# Patient Record
Sex: Female | Born: 1975 | Hispanic: Yes | Marital: Single | Smoking: Never smoker
Health system: Southern US, Community
[De-identification: ages and names within clinical notes are randomized; demographics above are authoritative.]

## PROBLEM LIST (undated history)

## (undated) DIAGNOSIS — E785 Hyperlipidemia, unspecified: Secondary | ICD-10-CM

## (undated) HISTORY — PX: NO PAST SURGERIES: SHX2092

---

## 1997-10-15 ENCOUNTER — Inpatient Hospital Stay (HOSPITAL_COMMUNITY): Admission: AD | Admit: 1997-10-15 | Discharge: 1997-10-17 | Payer: Self-pay | Admitting: Obstetrics & Gynecology

## 1997-10-23 ENCOUNTER — Encounter (HOSPITAL_COMMUNITY): Admission: RE | Admit: 1997-10-23 | Discharge: 1998-01-21 | Payer: Self-pay | Admitting: *Deleted

## 2001-10-28 ENCOUNTER — Ambulatory Visit (HOSPITAL_COMMUNITY): Admission: RE | Admit: 2001-10-28 | Discharge: 2001-10-28 | Payer: Self-pay | Admitting: *Deleted

## 2002-04-02 ENCOUNTER — Inpatient Hospital Stay (HOSPITAL_COMMUNITY): Admission: AD | Admit: 2002-04-02 | Discharge: 2002-04-04 | Payer: Self-pay | Admitting: *Deleted

## 2003-03-15 ENCOUNTER — Ambulatory Visit (HOSPITAL_COMMUNITY): Admission: RE | Admit: 2003-03-15 | Discharge: 2003-03-15 | Payer: Self-pay | Admitting: *Deleted

## 2003-07-22 ENCOUNTER — Inpatient Hospital Stay (HOSPITAL_COMMUNITY): Admission: AD | Admit: 2003-07-22 | Discharge: 2003-07-24 | Payer: Self-pay | Admitting: *Deleted

## 2006-04-24 ENCOUNTER — Ambulatory Visit: Payer: Self-pay | Admitting: *Deleted

## 2006-04-29 ENCOUNTER — Ambulatory Visit: Payer: Self-pay | Admitting: Internal Medicine

## 2006-09-02 ENCOUNTER — Ambulatory Visit: Payer: Self-pay | Admitting: Nurse Practitioner

## 2006-09-07 ENCOUNTER — Ambulatory Visit (HOSPITAL_COMMUNITY): Admission: RE | Admit: 2006-09-07 | Discharge: 2006-09-07 | Payer: Self-pay | Admitting: Family Medicine

## 2006-09-17 ENCOUNTER — Ambulatory Visit: Payer: Self-pay | Admitting: Nurse Practitioner

## 2006-10-23 ENCOUNTER — Emergency Department (HOSPITAL_COMMUNITY): Admission: EM | Admit: 2006-10-23 | Discharge: 2006-10-23 | Payer: Self-pay | Admitting: Emergency Medicine

## 2007-04-07 ENCOUNTER — Encounter (INDEPENDENT_AMBULATORY_CARE_PROVIDER_SITE_OTHER): Payer: Self-pay | Admitting: *Deleted

## 2007-06-23 ENCOUNTER — Ambulatory Visit: Payer: Self-pay | Admitting: Internal Medicine

## 2007-06-24 ENCOUNTER — Encounter (INDEPENDENT_AMBULATORY_CARE_PROVIDER_SITE_OTHER): Payer: Self-pay | Admitting: Nurse Practitioner

## 2007-06-24 LAB — CONVERTED CEMR LAB
BUN: 9 mg/dL (ref 6–23)
CO2: 22 meq/L (ref 19–32)
Calcium: 9.5 mg/dL (ref 8.4–10.5)
Chloride: 104 meq/L (ref 96–112)
Cholesterol: 210 mg/dL — ABNORMAL HIGH (ref 0–200)
Creatinine, Ser: 0.69 mg/dL (ref 0.40–1.20)
Glucose, Bld: 89 mg/dL (ref 70–99)
HDL: 46 mg/dL (ref >39)
LDL Cholesterol: 129 mg/dL — ABNORMAL HIGH (ref 0–99)
Potassium: 4 meq/L (ref 3.5–5.3)
Sodium: 141 meq/L (ref 135–145)
Total CHOL/HDL Ratio: 4.6
Triglycerides: 174 mg/dL — ABNORMAL HIGH (ref <150)
VLDL: 35 mg/dL (ref 0–40)

## 2007-09-29 ENCOUNTER — Ambulatory Visit: Payer: Self-pay | Admitting: Family Medicine

## 2007-09-29 ENCOUNTER — Encounter (INDEPENDENT_AMBULATORY_CARE_PROVIDER_SITE_OTHER): Payer: Self-pay | Admitting: Internal Medicine

## 2007-09-29 LAB — CONVERTED CEMR LAB
Cholesterol: 138 mg/dL (ref 0–200)
HDL: 37 mg/dL — ABNORMAL LOW (ref >39)
LDL Cholesterol: 76 mg/dL (ref 0–99)
Total CHOL/HDL Ratio: 3.7
Triglycerides: 127 mg/dL (ref <150)
VLDL: 25 mg/dL (ref 0–40)

## 2011-03-13 ENCOUNTER — Other Ambulatory Visit: Payer: Self-pay | Admitting: Family Medicine

## 2011-03-13 DIAGNOSIS — N63 Unspecified lump in unspecified breast: Secondary | ICD-10-CM

## 2011-03-20 ENCOUNTER — Ambulatory Visit
Admission: RE | Admit: 2011-03-20 | Discharge: 2011-03-20 | Disposition: A | Discharge status: Home / Self Care | Payer: No Typology Code available for payment source | Source: Ambulatory Visit | Attending: Family Medicine | Admitting: Family Medicine

## 2011-03-20 DIAGNOSIS — N63 Unspecified lump in unspecified breast: Secondary | ICD-10-CM

## 2014-01-30 ENCOUNTER — Other Ambulatory Visit (HOSPITAL_COMMUNITY): Payer: Self-pay | Admitting: *Deleted

## 2014-01-30 DIAGNOSIS — N6452 Nipple discharge: Secondary | ICD-10-CM

## 2014-01-31 ENCOUNTER — Encounter (HOSPITAL_COMMUNITY): Payer: Self-pay

## 2014-01-31 ENCOUNTER — Ambulatory Visit (HOSPITAL_COMMUNITY)
Admission: RE | Admit: 2014-01-31 | Discharge: 2014-01-31 | Disposition: A | Discharge status: Home / Self Care | Payer: Self-pay | Source: Ambulatory Visit | Attending: Obstetrics and Gynecology | Admitting: Obstetrics and Gynecology

## 2014-01-31 VITALS — BP 102/76 | Temp 98.3°F | Ht 64.0 in | Wt 184.2 lb

## 2014-01-31 DIAGNOSIS — N6452 Nipple discharge: Secondary | ICD-10-CM | POA: Insufficient documentation

## 2014-01-31 DIAGNOSIS — Z1239 Encounter for other screening for malignant neoplasm of breast: Secondary | ICD-10-CM

## 2014-01-31 HISTORY — DX: Hyperlipidemia, unspecified: E78.5

## 2014-01-31 NOTE — Progress Notes (Signed)
Complaints of left breast bloody discharge x 3 weeks when squeezed.  Pap Smear:  Pap smear not completed today. [facility] and normal. Per patient had an abnormal Pap smear 16 years ago that required a colposcopy for follow-up. Per patient all of her Pap smears have been normal since colposcopy and has at least 3 normal Pap smears completed. Last Pap smear result is in EPIC under media.  Physical exam: Breasts Breasts symmetrical. No skin abnormalities bilateral breasts. No nipple retraction bilateral breasts. No nipple discharge right breast. Bloody left nipple discharge when expressed. Sample of bloody discharge sent to cytology. No lymphadenopathy. No lumps palpated bilateral breasts. Complaints of left center breast tenderness on exam. [facility]. Appointment scheduled for Monday, February 06, 2014 at 1315.       Pelvic/Bimanual No Pap smear completed today since last Pap smear was 04/20/2013. Pap smear not indicated per BCCCP guidelines.

## 2014-01-31 NOTE — Patient Instructions (Signed)
Taught 11 Nicole Velasquez how to perform BSE and gave educational materials to take home. Patient did not need a Pap smear today due to last Pap smear was 04/20/2013. Let her know BCCCP will cover Pap smears every 3 years unless has a history of abnormal Pap smears. [facility]. Appointment scheduled for Monday, February 06, 2014 at 1315. Patient aware of appointment and will be there. Let patient know will follow up with her within the next couple weeks with results of breast discharge by phone. Nicole Velasquez verbalized understanding.  Rembert Browe, Kathaleen Maserhristine Poll, RN 3:26 PM

## 2014-02-06 ENCOUNTER — Ambulatory Visit
Admission: RE | Admit: 2014-02-06 | Discharge: 2014-02-06 | Disposition: A | Discharge status: Home / Self Care | Payer: No Typology Code available for payment source | Source: Ambulatory Visit | Attending: Obstetrics and Gynecology | Admitting: Obstetrics and Gynecology

## 2014-02-06 DIAGNOSIS — N6452 Nipple discharge: Secondary | ICD-10-CM

## 2014-05-22 ENCOUNTER — Encounter (HOSPITAL_COMMUNITY): Payer: Self-pay

## 2015-07-22 HISTORY — PX: BREAST CYST EXCISION: SHX579

## 2015-11-14 ENCOUNTER — Other Ambulatory Visit: Payer: Self-pay | Admitting: Obstetrics and Gynecology

## 2015-11-14 DIAGNOSIS — R1900 Intra-abdominal and pelvic swelling, mass and lump, unspecified site: Secondary | ICD-10-CM

## 2015-11-15 ENCOUNTER — Ambulatory Visit (HOSPITAL_COMMUNITY)
Admission: RE | Admit: 2015-11-15 | Discharge: 2015-11-15 | Disposition: A | Discharge status: Home / Self Care | Payer: Self-pay | Source: Ambulatory Visit | Attending: Obstetrics and Gynecology | Admitting: Obstetrics and Gynecology

## 2015-11-15 DIAGNOSIS — N838 Other noninflammatory disorders of ovary, fallopian tube and broad ligament: Secondary | ICD-10-CM | POA: Insufficient documentation

## 2015-11-15 DIAGNOSIS — R1900 Intra-abdominal and pelvic swelling, mass and lump, unspecified site: Secondary | ICD-10-CM | POA: Insufficient documentation

## 2015-11-15 MED ORDER — IOPAMIDOL (ISOVUE-300) INJECTION 61%
100.0000 mL | Freq: Once | INTRAVENOUS | Status: AC | PRN
  Administered 2015-11-15: 100 mL via INTRAVENOUS

## 2015-11-19 ENCOUNTER — Ambulatory Visit (INDEPENDENT_AMBULATORY_CARE_PROVIDER_SITE_OTHER): Payer: Self-pay | Admitting: Obstetrics & Gynecology

## 2015-11-19 ENCOUNTER — Encounter: Payer: Self-pay | Admitting: Obstetrics & Gynecology

## 2015-11-19 VITALS — BP 129/93 | HR 69 | Wt 169.0 lb

## 2015-11-19 DIAGNOSIS — R1900 Intra-abdominal and pelvic swelling, mass and lump, unspecified site: Secondary | ICD-10-CM

## 2015-11-19 NOTE — Patient Instructions (Signed)
Masa plvica (Pelvic Mass) Una masa plvica es un crecimiento anormal en la pelvis, que es la zona que se encuentra entre los huesos de las caderas. La pelvis incluye la vejiga y el recto en los hombres y las mujeres, y, tambin, el tero y los ovarios en las mujeres. CAUSAS Muchas cosas pueden causar una masa plvica, entre ellas:  Cncer.  Fibromas en el tero.  Quistes ovricos.  Infeccin.  Embarazo ectpico. SIGNOS Y SNTOMAS Los sntomas de una masa plvica pueden incluir lo siguiente:  Clicos.  Nuseas.  Diarrea.  Fiebre.  Vmitos.  Debilidad.  Dolor en la pelvis, en un costado o en la espalda.  Prdida de peso.  Estreimiento.  Problemas de hemorragias vaginales, por ejemplo:  Hemorragia vaginal leve o abundante, con o sin cogulos sanguneos.  Menstruacin irregular.  Dolor con la menstruacin.  Problemas relacionados con la orina, entre ellos:  Ganas frecuentes de orinar.  Imposibilidad de vaciar la vejiga completamente.  Orinar muy pocas cantidades.  Dolor al orinar.  Sangre en la orina. Algunas masas plvicas no causan sntomas. DIAGNSTICO Para hacer el diagnstico, el mdico tendr que obtener ms informacin sobre la masa. Pueden hacerle estudios o procedimientos, por ejemplo:  Anlisis de sangre.  Radiografas.  Ecografa.  Tomografa computarizada.  Resonancia magntica.  Una ciruga para examinar el interior del abdomen con cmaras (laparoscopia).  Una biopsia que se realiza con una aguja o durante una laparoscopia o una ciruga. En algunos casos, lo que parece ser una masa plvica puede ser otra cosa, como una masa en uno de los rganos que se encuentran cerca de la pelvis, una infeccin (absceso) o tejido cicatricial (adherencias) que se form despus de una ciruga. TRATAMIENTO El tratamiento depender de la causa de la masa. INSTRUCCIONES PARA EL CUIDADO EN EL HOGAR Lo que deba hacer en su casa depender de la causa de  la masa. Siga las indicaciones del mdico. En general:  Concurra a todas las visitas de control como se lo haya indicado el mdico. Esto es importante.  Tome los medicamentos solamente como se lo haya indicado el mdico.  Respete las restricciones que el mdico le haya impuesto. SOLICITE ATENCIN MDICA SI:  Presenta nuevos sntomas. SOLICITE ATENCIN MDICA DE INMEDIATO SI:  Vomita sangre de color rojo brillante o una sustancia parecida a los granos de caf.  Observa sangre en la materia fecal, o las heces se tornan negras y de aspecto alquitranado.  La hemorragia vaginal es anormal o se vuelve ms abundante.  Tiene fiebre.  Se le forman hematomas o sangra con facilidad.  Siente un dolor repentino o ms intenso que no puede controlar con los medicamentos.  Siente debilidad progresiva o sufre un desmayo.  Siente que la masa se agrand repentinamente.  Tiene una distensin abdominal o plvica importante.  No puede orinar.  No puede defecar.   Esta informacin no tiene como fin reemplazar el consejo del mdico. Asegrese de hacerle al mdico cualquier pregunta que tenga.   Document Released: 10/23/2008 Document Revised: 07/28/2014 Elsevier Interactive Patient Education 2016 Elsevier Inc.  

## 2015-11-19 NOTE — Progress Notes (Signed)
Spanish Interpreter Nile RiggsMariel Gallego   Pt [facility] US scheduled for June 15th @ 1245.  Pt notified.

## 2015-11-19 NOTE — Progress Notes (Signed)
Patient ID: Nicole Velasquez, female   DOB: May 21, 1976, 40 y.o.   MRN: [id number]  Chief Complaint  Patient presents with  . New Patient (Initial Visit)     discuss pelvic Mass    HPI Nicole Velasquez is a 40 y.o. female.  [id number] Patient's last menstrual period was 10/26/2015. Referred from [facility] with large right sided cystic mass. She notes left abdomina pressure and nausea and reflux.   HPI  Past Medical History  Diagnosis Date  . Hyperlipemia     No past surgical history on file.  Family History  Problem Relation Age of Onset  . Diabetes Father     Social History Social History  Substance Use Topics  . Smoking status: Never Smoker   . Smokeless tobacco: Never Used  . Alcohol Use: Yes     Comment: weekends    No Known Allergies  No current outpatient prescriptions on file.   No current facility-administered medications for this visit.    Review of Systems Review of Systems  Constitutional: Negative.   Respiratory: Negative.   Gastrointestinal: Positive for nausea. Negative for constipation and abdominal distention.  Genitourinary: Negative for menstrual problem and pelvic pain.    Blood pressure 129/93, pulse 69, weight 169 lb (76.658 kg), last menstrual period 10/26/2015.  Physical Exam Physical Exam  Constitutional: She is oriented to person, place, and time. She appears well-developed. No distress.  Pulmonary/Chest: Effort normal.  Abdominal: Soft. She exhibits mass.  Genitourinary: Vagina normal. No vaginal discharge found.  10+ cm mass superior to the uterus not tender  Neurological: She is alert and oriented to person, place, and time.  Skin: Skin is warm and dry.  Psychiatric: She has a normal mood and affect. Her behavior is normal.    Data Reviewed CLINICAL DATA: 40 year old female with a provided history of large 13.1 cm cystic right adnexal mass detected on outside pelvic sonogram performed for pelvic pressure.  EXAM: CT  PELVIS WITH CONTRAST  TECHNIQUE: Multidetector CT imaging of the pelvis was performed using the standard protocol following the bolus administration of intravenous contrast.  CONTRAST: ISOVUE-300 IOPAMIDOL (ISOVUE-300) INJECTION 61%  COMPARISON: Outside pelvic sonogram report from 11/12/2015 (images not available).  FINDINGS: Reproductive: Anteverted anteflexed uterus appears grossly normal. There is a large 10.6 x 8.1 x 13.7 cm right adnexal cystic mass (series 2/ image 16), which demonstrates simple fluid density and no mural nodules or internal septations. A separate normal right ovary is not visualized. There is a simple 2.8 cm left adnexal cyst. No additional adnexal masses.  Bladder: Normal. Distal ureters are normal caliber.  Bowel: Visualized small and large bowel are normal caliber with no bowel wall thickening. Visualized appendix is normal.  Vascular/Lymphatic: No pathologically enlarged lymph nodes in the pelvis.  Other: No pneumoperitoneum, ascites or focal fluid collection.  Musculoskeletal: No aggressive appearing focal osseous lesions. Minimal degenerative changes in the visualized lower lumbar spine.  IMPRESSION: 1. Simple 10.6 x 8.1 x 13.7 cm right adnexal cystic mass with no internal complexity by CT. A separate normal right ovary is not visualized. 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 2. Simple 2.8 cm left adnexal cyst, for which no further follow-up is required. This recommendation follows ACR consensus guidelines: White Paper of the ACR Incidental Findings Committee II on Adnexal Findings. J Am Coll Radiol [phone]. 3. No pelvic  adenopathy.   Electronically Signed  By: Delbert PhenixJason A Poff M.D.  On: 11/15/2015 14:39  Assessment    Large simple right  adnexal cyst  Abdominal/GI sx which may be unrelated to the pelvic mass     Plan    RTC 6-8 weeks after repeat US CA 125 today [facility] f/u if GI sx persist If mass is persistent consider surgical management       Nicole Velasquez 11/19/2015, 4:56 PM

## 2015-11-20 LAB — CA 125: CA 125: 15 U/mL (ref <35)

## 2015-11-21 ENCOUNTER — Telehealth: Payer: Self-pay

## 2015-11-21 NOTE — Telephone Encounter (Signed)
Pt is aware of lab results.

## 2015-11-23 ENCOUNTER — Ambulatory Visit (HOSPITAL_COMMUNITY): Payer: Self-pay

## 2015-11-23 ENCOUNTER — Encounter: Payer: Self-pay | Admitting: *Deleted

## 2015-12-21 ENCOUNTER — Encounter (HOSPITAL_COMMUNITY): Payer: Self-pay

## 2016-01-03 ENCOUNTER — Ambulatory Visit (HOSPITAL_COMMUNITY)
Admission: RE | Admit: 2016-01-03 | Discharge: 2016-01-03 | Disposition: A | Discharge status: Home / Self Care | Payer: Self-pay | Source: Ambulatory Visit | Attending: Obstetrics & Gynecology | Admitting: Obstetrics & Gynecology

## 2016-01-03 DIAGNOSIS — N9489 Other specified conditions associated with female genital organs and menstrual cycle: Secondary | ICD-10-CM | POA: Insufficient documentation

## 2016-01-03 DIAGNOSIS — R1900 Intra-abdominal and pelvic swelling, mass and lump, unspecified site: Secondary | ICD-10-CM

## 2016-01-03 DIAGNOSIS — N926 Irregular menstruation, unspecified: Secondary | ICD-10-CM | POA: Insufficient documentation

## 2016-01-10 ENCOUNTER — Encounter (HOSPITAL_COMMUNITY): Payer: Self-pay | Admitting: Family Medicine

## 2016-01-10 ENCOUNTER — Emergency Department (HOSPITAL_COMMUNITY): Payer: Self-pay

## 2016-01-10 ENCOUNTER — Emergency Department (HOSPITAL_COMMUNITY)
Admission: EM | Admit: 2016-01-10 | Discharge: 2016-01-10 | Disposition: A | Discharge status: Home / Self Care | Payer: Self-pay | Attending: Emergency Medicine | Admitting: Emergency Medicine

## 2016-01-10 ENCOUNTER — Other Ambulatory Visit: Payer: Self-pay

## 2016-01-10 DIAGNOSIS — E785 Hyperlipidemia, unspecified: Secondary | ICD-10-CM | POA: Insufficient documentation

## 2016-01-10 DIAGNOSIS — R0789 Other chest pain: Secondary | ICD-10-CM | POA: Insufficient documentation

## 2016-01-10 LAB — CBC
HCT: 39.3 % (ref 36.0–46.0)
Hemoglobin: 12.9 g/dL (ref 12.0–15.0)
MCH: 28.1 pg (ref 26.0–34.0)
MCHC: 32.8 g/dL (ref 30.0–36.0)
MCV: 85.6 fL (ref 78.0–100.0)
PLATELETS: 300 10*3/uL (ref 150–400)
RBC: 4.59 MIL/uL (ref 3.87–5.11)
RDW: 12.9 % (ref 11.5–15.5)
WBC: 10.1 10*3/uL (ref 4.0–10.5)

## 2016-01-10 LAB — BASIC METABOLIC PANEL
Anion gap: 9 (ref 5–15)
BUN: 7 mg/dL (ref 6–20)
CO2: 26 mmol/L (ref 22–32)
Calcium: 9.3 mg/dL (ref 8.9–10.3)
Chloride: 101 mmol/L (ref 101–111)
Creatinine, Ser: 0.77 mg/dL (ref 0.44–1.00)
Glucose, Bld: 99 mg/dL (ref 65–99)
POTASSIUM: 4.1 mmol/L (ref 3.5–5.1)
SODIUM: 136 mmol/L (ref 135–145)

## 2016-01-10 LAB — I-STAT TROPONIN, ED: Troponin i, poc: 0 ng/mL (ref 0.00–0.08)

## 2016-01-10 NOTE — ED Provider Notes (Signed)
CSN: [id number]     Arrival date & time 01/10/16  1040 History   First MD Initiated Contact with Patient 01/10/16 1428     Chief Complaint  Patient presents with  . Chest Pain     (Consider location/radiation/quality/duration/timing/severity/associated sxs/prior Treatment) HPI Comments: Patient is a 40 year old female with no past medical history. She presents for evaluation of sharp left-sided chest pain that started upon waking this morning. The pain is to the left anterior chest and worsens with breathing. She denies any shortness of breath, fever or cough. She denies any leg swelling or calf pain. He has no prior cardiac history, and only risk factors include high cholesterol.  Patient is a 40 y.o. female presenting with chest pain. The history is provided by the patient.  Chest Pain Pain location:  L chest Pain quality: sharp   Pain radiates to:  Does not radiate Pain radiates to the back: no   Pain severity:  Moderate Onset quality:  Sudden Duration:  8 hours Timing:  Constant Progression:  Unchanged Chronicity:  New Context: breathing and movement   Relieved by:  Rest Worsened by:  Nothing tried Ineffective treatments:  None tried   Past Medical History  Diagnosis Date  . Hyperlipemia    History reviewed. No pertinent past surgical history. Family History  Problem Relation Age of Onset  . Diabetes Father    Social History  Substance Use Topics  . Smoking status: Never Smoker   . Smokeless tobacco: Never Used  . Alcohol Use: Yes     Comment: weekends   OB History    Gravida Para Term Preterm AB TAB SAB Ectopic Multiple Living   3 3 3       3      Review of Systems  Cardiovascular: Positive for chest pain.  All other systems reviewed and are negative.     Allergies  Review of patient's allergies indicates no known allergies.  Home Medications   Prior to Admission medications   Not on File   BP 107/67 mmHg  Pulse 63  Temp(Src) 98.4 F (36.9 C)  (Oral)  Resp 20  Ht 5\' 4"  (1.626 m)  Wt 170 lb (77.111 kg)  BMI 29.17 kg/m2  SpO2 100% Physical Exam  Constitutional: She is oriented to person, place, and time. She appears well-developed and well-nourished. No distress.  HENT:  Head: Normocephalic and atraumatic.  Neck: Normal range of motion. Neck supple.  Cardiovascular: Normal rate and regular rhythm.  Exam reveals no gallop and no friction rub.   No murmur heard. Pulmonary/Chest: Effort normal and breath sounds normal. No respiratory distress. She has no wheezes. She has no rales. She exhibits tenderness.  There is tenderness to palpation of the left anterior chest wall. This and deep breath reproduces her symptoms.  Abdominal: Soft. Bowel sounds are normal. She exhibits no distension. There is no tenderness.  Musculoskeletal: Normal range of motion. She exhibits no edema.  There is no calf pain or tenderness and Homans sign is absent bilaterally.  Neurological: She is alert and oriented to person, place, and time.  Skin: Skin is warm and dry. She is not diaphoretic.  Nursing note and vitals reviewed.   ED Course  Procedures (including critical care time) Labs Review Labs Reviewed  BASIC METABOLIC PANEL  CBC  I-STAT TROPOININ, ED    Imaging Review Dg Chest 2 View  01/10/2016  CLINICAL DATA:  Chest pain EXAM: CHEST  2 VIEW COMPARISON:  None. FINDINGS: The heart size  and mediastinal contours are within normal limits. Both lungs are clear. The visualized skeletal structures are unremarkable. IMPRESSION: No active cardiopulmonary disease. Electronically Signed   By: Marlan Palauharles  Clark M.D.   On: 01/10/2016 11:35   I have personally reviewed and evaluated these images and lab results as part of my medical decision-making.   EKG Interpretation   Date/Time:  Thursday January 10 2016 10:55:58 EDT Ventricular Rate:  76 PR Interval:  154 QRS Duration: 78 QT Interval:  402 QTC Calculation: 452 R Axis:   60 Text Interpretation:   Normal sinus rhythm Nonspecific T wave abnormality  Abnormal ECG Confirmed by Giana Castner  MD, Allena Pietila ([id number]) on 01/10/2016 2:55:34  PM      MDM   Final diagnoses:  None    Patient presents with complaints of sharp left-sided chest pain that started this morning shortly after waking from sleep. This began in the absence of any injury or trauma. Her EKG shows nonspecific T wave abnormalities, however there are no old EKGs for comparison. Her chest x-ray is clear, troponin is negative, and her symptoms are not consistent with a cardiac etiology. I also highly doubt pulmonary embolism as there is no tachycardia, no hypoxia, and she has no risk factors which would predispose her to this. I suspect a musculoskeletal etiology, likely costochondritis. At this point she will be discharged with anti-inflammatories and when necessary return.    Geoffery Lyonsouglas Sheniqua Carolan, MD 01/10/16 [phone]

## 2016-01-10 NOTE — Discharge Instructions (Signed)
Ibuprofen 600 mg 3 times daily for the next 5 days.  Rest.  Follow-up with your primary Dr. if not improving, and return to the ER if symptoms significantly worsen or change.    Dolor en la pared torcica (Chest Wall Pain) El dolor en la pared torcica se produce en los huesos y los msculos del pecho o alrededor de Scientist, product/process developmentestos. A veces, una lesin Occupational psychologistcausa este dolor. En ocasiones, la causa puede ser desconocida. Este dolor puede durar varias semanas. INSTRUCCIONES PARA EL CUIDADO EN EL HOGAR  Est atento a cualquier cambio en los sntomas. Tome estas medidas para Acupuncturistaliviar el dolor:   Haga reposo como se lo haya indicado el mdico.   Evite las actividades que causan dolor. Estas pueden ser Charles Schwabaquellas que requieren el uso de los msculos del trax, los abdominales o los laterales para levantar objetos pesados.   Si se lo indican, aplique hielo sobre la zona dolorida:  Ponga el hielo en una bolsa plstica.  Coloque una toalla entre la piel y la bolsa de hielo.  Coloque el hielo durante 20minutos, 2 a 3veces por Futures traderda.  Tome los medicamentos de venta libre y los recetados solamente como se lo haya indicado el mdico.  No consuma productos que contengan tabaco, incluidos cigarrillos, tabaco de Theatre managermascar y Administrator, Civil Servicecigarrillos electrnicos. Si necesita ayuda para dejar de fumar, consulte al mdico.  Concurra a todas las visitas de control como se lo haya indicado el mdico. Esto es importante. SOLICITE ATENCIN MDICA SI:  Lance Mussiene fiebre.  El dolor de [place] empeora.  Aparecen nuevos sntomas. SOLICITE ATENCIN MDICA DE INMEDIATO SI:  Tiene nuseas o vmitos.  Berenice Primasranspira o tiene sensacin de desvanecimiento.  Tiene tos con flema (esputo) o expectora sangre al toser.  Le falta el aire.   Esta informacin no tiene Theme park managercomo fin reemplazar el consejo del mdico. Asegrese de hacerle al mdico cualquier pregunta que tenga.   Document Released: 08/18/2006 Document Revised: 03/28/2015 Elsevier  Interactive Patient Education [employer]2016 Elsevier Inc.

## 2016-01-10 NOTE — ED Notes (Signed)
Pt here for left sided chest pain worsening with breathing and moving, sts also hurting in upper back. sts a lot of upper body movement with her job.

## 2016-01-14 ENCOUNTER — Ambulatory Visit: Payer: Self-pay | Attending: Internal Medicine

## 2016-01-21 ENCOUNTER — Ambulatory Visit: Payer: Self-pay | Admitting: Obstetrics & Gynecology

## 2016-01-24 ENCOUNTER — Ambulatory Visit: Payer: Self-pay | Admitting: Obstetrics & Gynecology

## 2016-02-25 ENCOUNTER — Ambulatory Visit: Payer: Self-pay | Admitting: Obstetrics & Gynecology

## 2016-02-25 ENCOUNTER — Encounter: Payer: Self-pay | Admitting: Obstetrics & Gynecology

## 2016-02-25 NOTE — Progress Notes (Unsigned)
Nicole Velasquez missed a scheduled follow up. Speaks Spanish. Will have Spanish speaking registrar call to reschedule appt per Dr. Debroah LoopArnold.

## 2016-03-18 ENCOUNTER — Ambulatory Visit: Payer: Self-pay | Admitting: Family Medicine

## 2016-03-20 ENCOUNTER — Encounter: Payer: Self-pay | Admitting: Obstetrics & Gynecology

## 2016-03-20 ENCOUNTER — Ambulatory Visit (INDEPENDENT_AMBULATORY_CARE_PROVIDER_SITE_OTHER): Payer: Self-pay | Admitting: Obstetrics & Gynecology

## 2016-03-20 VITALS — BP 118/67 | HR 69 | Ht 64.0 in | Wt 164.0 lb

## 2016-03-20 DIAGNOSIS — N838 Other noninflammatory disorders of ovary, fallopian tube and broad ligament: Secondary | ICD-10-CM

## 2016-03-20 DIAGNOSIS — N839 Noninflammatory disorder of ovary, fallopian tube and broad ligament, unspecified: Secondary | ICD-10-CM

## 2016-03-20 NOTE — Progress Notes (Signed)
Here for follow up. Used Video interpreter Gershon Cullriscilla [phone]#[id number].  C/o when lying down c/o right side of abdomen gets " jumpy- throbbing".

## 2016-03-20 NOTE — Progress Notes (Signed)
Patient ID: Nicole Velasquez, female   DOB: 1975/10/07, 40 y.o.   MRN: [id number]  Chief Complaint  Patient presents with  . Follow-up  right ovarian cyst  [facility] is a 40 y.o. female.  [id number] Patient's last menstrual period was 02/15/2016. Large right ovarian simple cyst still present on f/u US 6/15. She notes some abdominal discomfort in certain positions. HPI  Past Medical History:  Diagnosis Date  . Hyperlipemia     History reviewed. No pertinent surgical history.  Family History  Problem Relation Age of Onset  . Diabetes Father     Social History Social History  Substance Use Topics  . Smoking status: Never Smoker  . Smokeless tobacco: Never Used  . Alcohol use Yes     Comment: weekends    No Known Allergies  No current outpatient prescriptions on file.   No current facility-administered medications for this visit.     Review of Systems Review of Systems  Constitutional: Negative.   Respiratory: Negative.   Gastrointestinal: Positive for abdominal pain. Negative for nausea and vomiting.  Genitourinary: Negative for menstrual problem, pelvic pain, vaginal bleeding and vaginal discharge.    Blood pressure 118/67, pulse 69, height 5\' 4"  (1.626 m), weight 164 lb (74.4 kg), last menstrual period 02/15/2016.  Physical Exam Physical Exam  Constitutional: She is oriented to person, place, and time. She appears well-developed. No distress.  Cardiovascular: Normal rate.   Pulmonary/Chest: Effort normal. No respiratory distress.  Abdominal: Soft. She exhibits mass (15 cm mobile not tender ).  Neurological: She is alert and oriented to person, place, and time.  Skin: Skin is warm and dry.  Psychiatric: She has a normal mood and affect. Her behavior is normal.  Vitals reviewed.   Data Reviewed CLINICAL DATA:  Mass detected on CT. Irregular cycles for 6 months. Gravida 3 para 3. LMP 12/03/2015.  EXAM: TRANSABDOMINAL AND TRANSVAGINAL  ULTRASOUND OF PELVIS  TECHNIQUE: Both transabdominal and transvaginal ultrasound examinations of the pelvis were performed. Transabdominal technique was performed for global imaging of the pelvis including uterus, ovaries, adnexal regions, and pelvic cul-de-sac. It was necessary to proceed with endovaginal exam following the transabdominal exam to visualize the uterus and adnexal region  COMPARISON:  11/15/2015 CT  FINDINGS: Uterus  Measurements: 7.1 x 3.1 x 5.1 cm. No fibroids or other mass visualized.  Endometrium  Thickness: 10.0 mm.  No focal abnormality visualized.  Right ovary  Measurements: A discrete right ovary is not visualized. Within the right adnexa and extending towards midline, there is a large cystic structure measuring 11.1 x 6.3 x 15.1 cm transabdominal images. Minimal internal debris noted. No solid or mural components noted.  Left ovary  Measurements: 2.2 x 1.7 x 3.8 cm. Largest follicle measures less than 3 cm. No solid component or suspicious features.  Other findings  No free pelvic fluid.  IMPRESSION: 1. Large cystic structure within the right adnexa and extending towards midline. Although no solid components are identified, this size is lesions precludes full evaluation by ultrasound. Further evaluation of simple-appearing cysts >7 cm with MRI or surgical evaluation is recommended according to the Society of Radiologists in Ultrasound 2010 Consensus Conference Statement (D Lenis NoonLevine et al. Management of Asymptomatic Ovarian and other Adnexal Cysts Imaged at US: Society of Radiologists in Ultrasound Consensus Conference Statement 2010. Radiology 256 (Sept 2010): 943-954.). 2. Normal appearance of the left ovary. 3. No free pelvic fluid.   Electronically Signed   By: Norva PavlovElizabeth  Brown M.D.  On: 01/03/2016 14:30  Assessment    Symptomatic ovarian simple cyst no sign of malignancy    Plan    RTC to schedule L/S surgery  when she can reapply for financial assistance. Report to [facility]U if emergency arises       ARNOLD,JAMES 03/20/2016, 10:59 AM

## 2016-03-20 NOTE — Patient Instructions (Signed)
Quiste ovrico (Ovarian Cyst) Un quiste ovrico es una bolsa llena de lquido que se forma en el ovario. Los ovarios son los rganos pequeos que producen vulos en las mujeres. Se pueden formar varios tipos de quistes en los ovarios. La mayora no son cancerosos. Muchos de ellos no causan problemas y con frecuencia desaparecen solos. Algunos pueden provocar sntomas y requerir tratamiento. Los tipos ms comunes de quistes ovricos son los siguientes:  Quistes funcionales: estos quistes pueden aparecer todos los meses durante el ciclo menstrual. Esto es normal. Estos quistes suelen desaparecer con el prximo ciclo menstrual si la mujer no queda embarazada. En general, los quistes funcionales no tienen sntomas.  Endometriomas: estos quistes se forman a partir del tejido que recubre el tero. Tambin se denominan "quistes de chocolate" porque se llenan de sangre que se vuelve marrn. Este tipo de quiste puede provocar dolor en la zona inferior del abdomen durante la relacin sexual y con el perodo menstrual.  Cistoadenomas: este tipo se desarrolla a partir de las clulas que se ubican en el exterior del ovario. Estos quistes pueden ser muy grandes y causar dolor en la zona inferior del abdomen y durante la relacin sexual. Este tipo de quiste puede girar sobre s mismo, cortar el suministro de sangre y causar un dolor intenso. Tambin se puede romper con facilidad y provocar mucho dolor.  Quistes dermoides: este tipo de quiste a veces se encuentra en ambos ovarios. Estos quistes pueden contener diferentes tipos de tejidos del organismo, como piel, dientes, pelo o cartlago. Generalmente no tienen sntomas, a menos que sean muy grandes.  Quistes tecalutenicos: aparecen cuando se produce demasiada cantidad de cierta hormona (gonadotropina corinica humana) que estimula en exceso al ovario para que produzca vulos. Esto es ms frecuente despus de procedimientos que ayudan a la concepcin de un beb  (fertilizacin in vitro). CAUSAS   Los medicamentos para la fertilidad pueden provocar una afeccin mediante la cual se forman mltiples quistes de gran tamao en los ovarios. Esta se denomina sndrome de hiperestimulacin ovrica.  El sndrome del ovario poliqustico es una afeccin que puede causar desequilibrios hormonales, los cuales pueden dar como resultado quistes ovricos no funcionales. SIGNOS Y SNTOMAS  Muchos quistes ovricos no causan sntomas. Si se presentan sntomas, stos pueden ser:  Dolor o molestias en la pelvis.  Dolor en la parte baja del abdomen.  Dolor durante las relaciones sexuales.  Aumento del permetro abdominal (hinchazn).  Perodos menstruales anormales.  Aumento del dolor en los perodos menstruales.  Cese de los perodos menstruales sin estar embarazada. DIAGNSTICO  Estos quistes se descubren comnmente durante un examen de rutina o una exploracin ginecolgica anual. Es posible que se ordenen otros estudios para obtener ms informacin sobre el quiste. Estos estudios pueden ser:  Ecografas.  Radiografas de la pelvis.  Tomografa computada.  Resonancia magntica.  Anlisis de sangre. TRATAMIENTO  Muchos de los quistes ovricos desaparecen por s solos, sin tratamiento. Es probable que el mdico quiera controlar el quiste regularmente durante 2 o 3meses para ver si se produce algn cambio. En el caso de las mujeres en la menopausia, es particularmente importante controlar de cerca al quiste ya que el ndice de cncer de ovario en las mujeres menopusicas es ms alto. Cuando se requiere tratamiento, este puede incluir cualquiera de los siguientes:  Un procedimiento para drenar el quiste (aspiracin). Esto se puede realizar mediante el uso de una aguja grande y una ecografa. Tambin se puede hacer a travs de un procedimiento laparoscpico, En   este procedimiento, se inserta un tubo delgado que emite luz y que tiene una pequea cmara en un  extremo (laparoscopio) a travs de una pequea incisin.  Ciruga para extirpar el quiste completo. Esto se puede realizar mediante una ciruga laparoscpica o una ciruga abierta, la cual implica realizar una incisin ms grande en la parte inferior del abdomen.  Tratamiento hormonal o pldoras anticonceptivas. Estos mtodos a veces se usan para ayudar a disolver un quiste. INSTRUCCIONES PARA EL CUIDADO EN EL HOGAR   Tome solo medicamentos de venta libre o recetados, segn las indicaciones del mdico.  Concurra a las consultas de control con su mdico segn las indicaciones.  Hgase exmenes plvicos regulares y pruebas de Papanicolaou. SOLICITE ATENCIN MDICA SI:   Los perodos se atrasan, son irregulares, dolorosos o cesan.  El dolor plvico o abdominal no desaparece.  El abdomen se agranda o se hincha.  Siente presin en la vejiga o no puede vaciarla completamente.  Siente dolor durante las relaciones sexuales.  Tiene una sensacin de hinchazn, presin o molestias en el estmago.  Pierde peso sin razn aparente.  Siente un malestar generalizado.  Est estreida.  Pierde el apetito.  Le aparece acn.  Nota un aumento del vello corporal y facial.  Aumenta de peso sin hacer modificaciones en su actividad fsica y en su dieta habitual.  Sospecha que est embarazada. SOLICITE ATENCIN MDICA DE INMEDIATO SI:   Siente cada vez ms dolor abdominal.  Tiene malestar estomacal (nuseas) y vomita.  Tiene fiebre que se presenta de manera repentina.  Siente dolor abdominal al defecar.  Sus perodos menstruales son ms abundantes que lo habitual. ASEGRESE DE QUE:   Comprende estas instrucciones.  Controlar su afeccin.  Recibir ayuda de inmediato si no mejora o si empeora.   Esta informacin no tiene como fin reemplazar el consejo del mdico. Asegrese de hacerle al mdico cualquier pregunta que tenga.   Document Released: 04/16/2005 Document Revised:  07/12/2013 Elsevier Interactive Patient Education 2016 Elsevier Inc.  

## 2016-08-20 ENCOUNTER — Inpatient Hospital Stay (HOSPITAL_COMMUNITY)
Admission: AD | Admit: 2016-08-20 | Discharge: 2016-08-20 | Disposition: A | Discharge status: Home / Self Care | Payer: Self-pay | Source: Ambulatory Visit | Attending: Family Medicine | Admitting: Family Medicine

## 2016-08-20 ENCOUNTER — Encounter (HOSPITAL_COMMUNITY): Payer: Self-pay | Admitting: *Deleted

## 2016-08-20 DIAGNOSIS — Z3202 Encounter for pregnancy test, result negative: Secondary | ICD-10-CM | POA: Insufficient documentation

## 2016-08-20 DIAGNOSIS — N83201 Unspecified ovarian cyst, right side: Secondary | ICD-10-CM | POA: Insufficient documentation

## 2016-08-20 LAB — CBC
HCT: 39.2 % (ref 36.0–46.0)
Hemoglobin: 13.4 g/dL (ref 12.0–15.0)
MCH: 29.1 pg (ref 26.0–34.0)
MCHC: 34.2 g/dL (ref 30.0–36.0)
MCV: 85.2 fL (ref 78.0–100.0)
Platelets: 328 10*3/uL (ref 150–400)
RBC: 4.6 MIL/uL (ref 3.87–5.11)
RDW: 12.7 % (ref 11.5–15.5)
WBC: 10.1 10*3/uL (ref 4.0–10.5)

## 2016-08-20 LAB — URINALYSIS, ROUTINE W REFLEX MICROSCOPIC
BILIRUBIN URINE: NEGATIVE
Bacteria, UA: NONE SEEN
Glucose, UA: NEGATIVE mg/dL
Ketones, ur: NEGATIVE mg/dL
LEUKOCYTES UA: NEGATIVE
NITRITE: NEGATIVE
Protein, ur: NEGATIVE mg/dL
Specific Gravity, Urine: 1.006 (ref 1.005–1.030)
pH: 7 (ref 5.0–8.0)

## 2016-08-20 LAB — POCT PREGNANCY, URINE: Preg Test, Ur: NEGATIVE

## 2016-08-20 NOTE — Discharge Instructions (Signed)
Quiste ovrico (Ovarian Cyst) Un quiste ovrico es un saco lleno de lquido o sangre. Este saco est unido al ovario. Algunos quistes desaparecen solos. Otros quistes necesitan tratamiento.  CUIDADOS EN EL HOGAR   Solo tome los medicamentos que le haya indicado su mdico.  Concurra a las consultas de control con el mdico, segn las indicaciones.  Hgase exmenes plvicos regulares y pruebas de Papanicolaou. SOLICITE AYUDA SI:  Sus perodos se atrasan o son irregulares o dolorosos.  Sus perodos cesan.  Siente dolor en el vientre (abdominal) o en la pelvis que no desaparece.  El abdomen se agranda o se inflama hincha.  Tiene dificultades para orinar (vaciar por completo la vejiga).  Siente presin en la vejiga.  Siente dolor durante las relaciones sexuales.  Siente distensin, presin o molestias en el estmago.  Pierde peso sin ningn motivo.  Se siente mal constantemente.  Tiene dificultades para defecar (estreimiento).  No tiene ganas de comer.  Le aparecen granos (acn).  Nota un aumento del vello corporal y facial.  Sube de peso sin motivo.  Sospecha que est embarazada. SOLICITE AYUDA DE INMEDIATO SI:   El dolor en el vientre empeora.  Tiene malestar estomacal (nuseas) y vomita.  Le sube la fiebre rpidamente.  Le duele el estmago mientras defeca.  Los perodos menstruales son ms abundantes de lo habitual. ASEGRESE DE QUE:   Comprende estas instrucciones.  Controlar su afeccin.  Recibir ayuda de inmediato si no mejora o si empeora. Esta informacin no tiene como fin reemplazar el consejo del mdico. Asegrese de hacerle al mdico cualquier pregunta que tenga. Document Released: 04/27/2013 Elsevier Interactive Patient Education  2017 Elsevier Inc.  

## 2016-08-20 NOTE — MAU Note (Addendum)
Clinic told her to come up here to be evaluated for a cyst. No pain feeling pressure on the right side. "feels pregnant, bloated- abd is getting larger, esp at night"

## 2016-08-20 NOTE — MAU Provider Note (Signed)
History     CSN: [id number]  Arrival date and time: 08/20/16 1114   First Provider Initiated Contact with Patient 08/20/16 1258      No chief complaint on file.  HPI   Nicole Velasquez is a 41 y.o. female [phone] here in MAU with questions and concerns. She was told back in May that she needed surgery to remove a large cyst, however she did not apply for assistance and was told to reapply in November for the assistance. She turned the paper work in in October and was denied again due to it being "too early". She was told that she needed to wait 6 months to reapply again.  She did not attempt to call the clinic to schedule and appointment for further assistance.  She says she called the clinic and was told to come to the MAU because "maybe the Dr. Eulah Citizen just do her surgery from MAU". She says the person who told her this was spanish speaking.   Currently she is without pain. She is just worried. She feels bloated in her abdomen. She was last seen in the clinic in August and Dr. Debroah Loop recommended surgery if the cyst became symptomatic.   OB History    Gravida Para Term Preterm AB Living   3 3 3     3    SAB TAB Ectopic Multiple Live Births                  Past Medical History:  Diagnosis Date  . Hyperlipemia     History reviewed. No pertinent surgical history.  Family History  Problem Relation Age of Onset  . Diabetes Father     Social History  Substance Use Topics  . Smoking status: Never Smoker  . Smokeless tobacco: Never Used  . Alcohol use Yes     Comment: weekends    Allergies: No Known Allergies  No prescriptions prior to admission.   Results for orders placed or performed during the hospital encounter of 08/20/16 (from the past 48 hour(s))  Urinalysis, Routine w reflex microscopic     Status: Abnormal   Collection Time: 08/20/16 11:45 AM  Result Value Ref Range   Color, Urine STRAW (A) YELLOW   APPearance CLEAR CLEAR   Specific Gravity, Urine  1.006 1.005 - 1.030   pH 7.0 5.0 - 8.0   Glucose, UA NEGATIVE NEGATIVE mg/dL   Hgb urine dipstick SMALL (A) NEGATIVE   Bilirubin Urine NEGATIVE NEGATIVE   Ketones, ur NEGATIVE NEGATIVE mg/dL   Protein, ur NEGATIVE NEGATIVE mg/dL   Nitrite NEGATIVE NEGATIVE   Leukocytes, UA NEGATIVE NEGATIVE   RBC / HPF 0-5 0 - 5 RBC/hpf   WBC, UA 0-5 0 - 5 WBC/hpf   Bacteria, UA NONE SEEN NONE SEEN   Squamous Epithelial / LPF 0-5 (A) NONE SEEN  Pregnancy, urine POC     Status: None   Collection Time: 08/20/16 11:47 AM  Result Value Ref Range   Preg Test, Ur NEGATIVE NEGATIVE    Comment:        THE SENSITIVITY OF THIS METHODOLOGY IS >24 mIU/mL   CBC     Status: None   Collection Time: 08/20/16  1:30 PM  Result Value Ref Range   WBC 10.1 4.0 - 10.5 K/uL   RBC 4.60 3.87 - 5.11 MIL/uL   Hemoglobin 13.4 12.0 - 15.0 g/dL   HCT 14.7 82.9 - 56.2 %   MCV 85.2 78.0 - 100.0 fL   MCH  29.1 26.0 - 34.0 pg   MCHC 34.2 30.0 - 36.0 g/dL   RDW 16.112.7 09.611.5 - 04.515.5 %   Platelets 328 150 - 400 K/uL    Review of Systems  Constitutional: Negative for fever.  Gastrointestinal: Negative for diarrhea, nausea and vomiting.   Physical Exam   Blood pressure 122/86, pulse 70, temperature 98.1 F (36.7 C), temperature source Oral, resp. rate 16, height 5' 2.5" (1.588 m), weight 170 lb (77.1 kg), last menstrual period 08/04/2016, SpO2 100 %.  Physical Exam  Constitutional: She is oriented to person, place, and time. She appears well-developed and well-nourished. No distress.  GI: Soft. She exhibits mass.  Large mass noted, mobile, some tenderness with deep palpation.   Musculoskeletal: Normal range of motion.  Neurological: She is alert and oriented to person, place, and time.  Skin: Skin is warm. She is not diaphoretic.  Psychiatric: Her behavior is normal.   MAU Course  Procedures  None  MDM  CBC UA  Patient is without pain at this time.   Assessment and Plan   A:  1. Cyst of right ovary      P:  Discharge home in stable condition  Highly recommend patient call the clinic to schedule an appointment with a surgeon.  [facility]  Return to MAU for OB emergencies.    Duane LopeJennifer I Ponciano Shealy, NP 08/20/2016 4:30 PM

## 2016-09-12 ENCOUNTER — Encounter: Payer: Self-pay | Admitting: Obstetrics & Gynecology

## 2016-09-12 ENCOUNTER — Ambulatory Visit (INDEPENDENT_AMBULATORY_CARE_PROVIDER_SITE_OTHER): Payer: Self-pay | Admitting: Obstetrics & Gynecology

## 2016-09-12 VITALS — BP 116/80 | HR 78 | Wt 170.8 lb

## 2016-09-12 DIAGNOSIS — N838 Other noninflammatory disorders of ovary, fallopian tube and broad ligament: Secondary | ICD-10-CM

## 2016-09-12 DIAGNOSIS — N839 Noninflammatory disorder of ovary, fallopian tube and broad ligament, unspecified: Secondary | ICD-10-CM

## 2016-09-12 NOTE — Progress Notes (Signed)
Spanish video interpreter Burton ApleyChia # [id number] used for visit.

## 2016-09-12 NOTE — Progress Notes (Signed)
Patient ID: Nicole Velasquez, female   DOB: 27-Aug-1975, 41 y.o.   MRN: [id number]  Chief Complaint  Patient presents with  . Advice Only    HPI Nicole Velasquez is a 41 y.o. female.  [id number] Patient's last menstrual period was 08/23/2016 (exact date). Her large ovarian cyst is symptomatic with pain and pressure, some early satiety but no nausea.  HPI  Past Medical History:  Diagnosis Date  . Hyperlipemia     No past surgical history on file.  Family History  Problem Relation Age of Onset  . Diabetes Father     Social History Social History  Substance Use Topics  . Smoking status: Never Smoker  . Smokeless tobacco: Never Used  . Alcohol use Yes     Comment: weekends    No Known Allergies  Current Outpatient Prescriptions  Medication Sig Dispense Refill  . etonogestrel (NEXPLANON) 68 MG IMPL implant 1 each by Subdermal route once.     No current facility-administered medications for this visit.     Review of Systems Review of Systems  Constitutional: Positive for appetite change.  Gastrointestinal: Positive for abdominal distention.  Genitourinary: Positive for pelvic pain. Negative for menstrual problem and vaginal bleeding.    Blood pressure 116/80, pulse 78, weight 170 lb 12.8 oz (77.5 kg), last menstrual period 08/23/2016.  Physical Exam Physical Exam  Constitutional: She appears well-developed. No distress.  Pulmonary/Chest: Effort normal.  Abdominal: Soft. She exhibits mass (above umbilicus. Soft).  Skin: Skin is warm.  Psychiatric: She has a normal mood and affect. Her behavior is normal.    Data Reviewed CLINICAL DATA:  Mass detected on CT. Irregular cycles for 6 months. Gravida 3 para 3. LMP 12/03/2015.  EXAM: TRANSABDOMINAL AND TRANSVAGINAL ULTRASOUND OF PELVIS  TECHNIQUE: Both transabdominal and transvaginal ultrasound examinations of the pelvis were performed. Transabdominal technique was performed for global imaging of the  pelvis including uterus, ovaries, adnexal regions, and pelvic cul-de-sac. It was necessary to proceed with endovaginal exam following the transabdominal exam to visualize the uterus and adnexal region  COMPARISON:  11/15/2015 CT  FINDINGS: Uterus  Measurements: 7.1 x 3.1 x 5.1 cm. No fibroids or other mass visualized.  Endometrium  Thickness: 10.0 mm.  No focal abnormality visualized.  Right ovary  Measurements: A discrete right ovary is not visualized. Within the right adnexa and extending towards midline, there is a large cystic structure measuring 11.1 x 6.3 x 15.1 cm transabdominal images. Minimal internal debris noted. No solid or mural components noted.  Left ovary  Measurements: 2.2 x 1.7 x 3.8 cm. Largest follicle measures less than 3 cm. No solid component or suspicious features.  Other findings  No free pelvic fluid.  IMPRESSION: 1. Large cystic structure within the right adnexa and extending towards midline. Although no solid components are identified, this size is lesions precludes full evaluation by ultrasound. Further evaluation of simple-appearing cysts >7 cm with MRI or surgical evaluation is recommended according to the Society of Radiologists in Ultrasound 2010 Consensus Conference Statement (D Lenis NoonLevine et al. Management of Asymptomatic Ovarian and other Adnexal Cysts Imaged at US: Society of Radiologists in Ultrasound Consensus Conference Statement 2010. Radiology 256 (Sept 2010): 943-954.). 2. Normal appearance of the left ovary. 3. No free pelvic fluid.   Electronically Signed   By: Norva PavlovElizabeth  Brown M.D.  Assessment    Large right adnexal simple cyst, syptomatic    Plan    Will contact patient about scheduling surgical management. Financial assistance has been  denied       Nicole Velasquez 09/12/2016, 9:22 AM

## 2016-09-12 NOTE — Patient Instructions (Signed)
Quiste ovrico (Ovarian Cyst) Un quiste ovrico es un saco lleno de lquido o sangre. Este saco est unido al ovario. Algunos quistes desaparecen solos. Otros quistes necesitan tratamiento.  CUIDADOS EN EL HOGAR   Solo tome los medicamentos que le haya indicado su mdico.  Concurra a las consultas de control con el mdico, segn las indicaciones.  Hgase exmenes plvicos regulares y pruebas de Papanicolaou. SOLICITE AYUDA SI:  Sus perodos se atrasan o son irregulares o dolorosos.  Sus perodos cesan.  Siente dolor en el vientre (abdominal) o en la pelvis que no desaparece.  El abdomen se agranda o se inflama hincha.  Tiene dificultades para orinar (vaciar por completo la vejiga).  Siente presin en la vejiga.  Siente dolor durante las relaciones sexuales.  Siente distensin, presin o molestias en el estmago.  Pierde peso sin ningn motivo.  Se siente mal constantemente.  Tiene dificultades para defecar (estreimiento).  No tiene ganas de comer.  Le aparecen granos (acn).  Nota un aumento del vello corporal y facial.  Sube de peso sin motivo.  Sospecha que est embarazada. SOLICITE AYUDA DE INMEDIATO SI:   El dolor en el vientre empeora.  Tiene malestar estomacal (nuseas) y vomita.  Le sube la fiebre rpidamente.  Le duele el estmago mientras defeca.  Los perodos menstruales son ms abundantes de lo habitual. ASEGRESE DE QUE:   Comprende estas instrucciones.  Controlar su afeccin.  Recibir ayuda de inmediato si no mejora o si empeora. Esta informacin no tiene como fin reemplazar el consejo del mdico. Asegrese de hacerle al mdico cualquier pregunta que tenga. Document Released: 04/27/2013 Elsevier Interactive Patient Education  2017 Elsevier Inc.  

## 2016-09-24 ENCOUNTER — Encounter (HOSPITAL_COMMUNITY): Payer: Self-pay

## 2016-11-20 ENCOUNTER — Other Ambulatory Visit: Payer: Self-pay | Admitting: Obstetrics & Gynecology

## 2016-11-24 NOTE — Patient Instructions (Addendum)
Your procedure is scheduled on:  Thursday, Dec 04, 2016  [facility]  7[facility]30 AM  Pick up the phone at the desk and dial [phone].  Call this number if you have problems the morning of surgery: [phone].  Remember: Do NOT eat food or drink after:  Midnight Wednesday  Take these medicines the morning of surgery with a SIP OF WATER:  None  Stop ALL herbal medications at this time  Do NOT smoke the day of surgery.  Do NOT wear jewelry (body piercing), metal hair clips/bobby pins, make-up, or nail polish. Do NOT wear lotions, powders, or perfumes.  You may wear deodorant. Do NOT shave for 48 hours prior to surgery. Do NOT bring valuables to the hospital. Contacts, dentures, or bridgework may not be worn into surgery.  Leave suitcase in car.  After surgery it may be brought to your room.  For patients admitted to the hospital, checkout time is 11:00 AM the day of discharge.  Bring a copy of your healthcare power of attorney and living will documents.   Instrucciones:  Su cirugia esta programada para-( your procedure is scheduled on) : Thursday, Dec 04, 2016   Entre por la entrada principal a la(s) -(enter through the main entrance at): 7:30 AM   [address],  [place] el [phone] e informenos de su llegada ( pick up phone, dial [phone] on arrival)  Por favor llame al [phone] si tiene algun problema la Lily [place]ana de cirugia ( please call [phone] if you have any problems the morning of surgery.)  Recuerde: (Remember)  No coma alimentos ni tome liquidos, incluyendo agua, despues de la medianoche del  ( Do not eat food or drink liquids including water after midnight on Midnight Wednesday   Tome estas medicinas la [place] de la cirugia con un sorbito de agua (take these meds the morning of surgery with a SIP of water)  None  Puede cepillarse los dientes en la manana de la [place]. (you may brush your teeth the morning of  surgery)  NO use joyas, maquillaje de ojos, lapiz labial, crema para el cuerpo o esmalte de unas oscuro - las unas de los pies pueden estar pintados. ( Do not wear jewelry, eye makeup, lipstick, body lotion, or dark fingernail polish)  Puede usar desodorante ( you may wear deodorant)  Si va a ser ingresado despues de las [place], deje la [place] en el carro hasta que se le haya asignado una habitacion. ( If you are to be admitted after surgery, leave suitcase in car until your room has been assigned.)  A los pacientes que se les de de alta el mismo dia no se les permitira manejar a casa.  ( Patients discharged on the day of surgery will not be allowed to drive home)  Use ropa suelta y comoda de regreso a Technical sales engineercaso. ( wear loose comfortable clothes for ride home)  Firma del paciente (patient signature) ______________________________________

## 2016-11-25 ENCOUNTER — Encounter (HOSPITAL_COMMUNITY)
Admission: RE | Admit: 2016-11-25 | Discharge: 2016-11-25 | Disposition: A | Discharge status: Home / Self Care | Payer: Self-pay | Source: Ambulatory Visit | Attending: Obstetrics & Gynecology | Admitting: Obstetrics & Gynecology

## 2016-11-25 ENCOUNTER — Encounter (HOSPITAL_COMMUNITY): Payer: Self-pay

## 2016-11-25 DIAGNOSIS — Z01812 Encounter for preprocedural laboratory examination: Secondary | ICD-10-CM | POA: Insufficient documentation

## 2016-11-25 LAB — CBC
HCT: 41.4 % (ref 36.0–46.0)
HEMOGLOBIN: 14.1 g/dL (ref 12.0–15.0)
MCH: 29.7 pg (ref 26.0–34.0)
MCHC: 34.1 g/dL (ref 30.0–36.0)
MCV: 87.3 fL (ref 78.0–100.0)
Platelets: 287 10*3/uL (ref 150–400)
RBC: 4.74 MIL/uL (ref 3.87–5.11)
RDW: 13 % (ref 11.5–15.5)
WBC: 14.5 10*3/uL — ABNORMAL HIGH (ref 4.0–10.5)

## 2016-11-25 LAB — TYPE AND SCREEN
ABO/RH(D): B POS
ANTIBODY SCREEN: NEGATIVE

## 2016-11-25 LAB — ABO/RH: ABO/RH(D): B POS

## 2016-11-25 NOTE — Pre-Procedure Instructions (Signed)
Used [employer] video interpreting for PAT appointment Abby [id number].  Session ended abruptly new session with Nicole Velasquez ID# [id number].

## 2016-11-26 LAB — CA 125: CA 125: 27.9 U/mL (ref 0.0–38.1)

## 2016-11-30 ENCOUNTER — Other Ambulatory Visit: Payer: Self-pay

## 2016-11-30 ENCOUNTER — Inpatient Hospital Stay (EMERGENCY_DEPARTMENT_HOSPITAL)
Admission: AD | Admit: 2016-11-30 | Discharge: 2016-11-30 | Disposition: A | Discharge status: Home / Self Care | Payer: Medicaid Other | Source: Ambulatory Visit | Attending: Obstetrics & Gynecology | Admitting: Obstetrics & Gynecology

## 2016-11-30 ENCOUNTER — Encounter (HOSPITAL_COMMUNITY): Payer: Self-pay

## 2016-11-30 ENCOUNTER — Inpatient Hospital Stay (HOSPITAL_COMMUNITY): Payer: Medicaid Other | Admitting: Anesthesiology

## 2016-11-30 ENCOUNTER — Inpatient Hospital Stay (HOSPITAL_COMMUNITY)
Admission: AD | Admit: 2016-11-30 | Discharge: 2016-12-02 | DRG: 743 | Disposition: A | Discharge status: Home / Self Care | Payer: Medicaid Other | Source: Ambulatory Visit | Attending: Obstetrics & Gynecology | Admitting: Obstetrics & Gynecology

## 2016-11-30 ENCOUNTER — Inpatient Hospital Stay (HOSPITAL_COMMUNITY): Payer: Medicaid Other

## 2016-11-30 ENCOUNTER — Encounter (HOSPITAL_COMMUNITY)
Admission: AD | Disposition: A | Discharge status: Home / Self Care | Payer: Self-pay | Source: Ambulatory Visit | Attending: Obstetrics & Gynecology

## 2016-11-30 DIAGNOSIS — N83511 Torsion of right ovary and ovarian pedicle: Secondary | ICD-10-CM | POA: Diagnosis present

## 2016-11-30 DIAGNOSIS — N83201 Unspecified ovarian cyst, right side: Secondary | ICD-10-CM | POA: Diagnosis present

## 2016-11-30 DIAGNOSIS — R1900 Intra-abdominal and pelvic swelling, mass and lump, unspecified site: Secondary | ICD-10-CM

## 2016-11-30 DIAGNOSIS — N8353 Torsion of ovary, ovarian pedicle and fallopian tube: Secondary | ICD-10-CM | POA: Diagnosis present

## 2016-11-30 DIAGNOSIS — E785 Hyperlipidemia, unspecified: Secondary | ICD-10-CM | POA: Diagnosis present

## 2016-11-30 DIAGNOSIS — R109 Unspecified abdominal pain: Secondary | ICD-10-CM

## 2016-11-30 DIAGNOSIS — N838 Other noninflammatory disorders of ovary, fallopian tube and broad ligament: Secondary | ICD-10-CM | POA: Diagnosis present

## 2016-11-30 DIAGNOSIS — R1031 Right lower quadrant pain: Secondary | ICD-10-CM | POA: Insufficient documentation

## 2016-11-30 HISTORY — PX: LAPAROTOMY: SHX154

## 2016-11-30 LAB — TYPE AND SCREEN
ABO/RH(D): B POS
ABO/RH(D): B POS
ANTIBODY SCREEN: NEGATIVE
Antibody Screen: NEGATIVE

## 2016-11-30 LAB — URINALYSIS, ROUTINE W REFLEX MICROSCOPIC
Bilirubin Urine: NEGATIVE
GLUCOSE, UA: NEGATIVE mg/dL
HGB URINE DIPSTICK: NEGATIVE
Ketones, ur: NEGATIVE mg/dL
LEUKOCYTES UA: NEGATIVE
Nitrite: NEGATIVE
PH: 6 (ref 5.0–8.0)
PROTEIN: NEGATIVE mg/dL
Specific Gravity, Urine: 1.006 (ref 1.005–1.030)

## 2016-11-30 LAB — CBC WITH DIFFERENTIAL/PLATELET
BASOS ABS: 0 10*3/uL (ref 0.0–0.1)
BASOS PCT: 0 %
EOS PCT: 4 %
Eosinophils Absolute: 0.5 10*3/uL (ref 0.0–0.7)
HCT: 41.2 % (ref 36.0–46.0)
Hemoglobin: 14 g/dL (ref 12.0–15.0)
Lymphocytes Relative: 48 %
Lymphs Abs: 5.8 10*3/uL — ABNORMAL HIGH (ref 0.7–4.0)
MCH: 29 pg (ref 26.0–34.0)
MCHC: 34 g/dL (ref 30.0–36.0)
MCV: 85.5 fL (ref 78.0–100.0)
MONO ABS: 1.1 10*3/uL — AB (ref 0.1–1.0)
MONOS PCT: 9 %
NEUTROS ABS: 4.8 10*3/uL (ref 1.7–7.7)
Neutrophils Relative %: 40 %
PLATELETS: 280 10*3/uL (ref 150–400)
RBC: 4.82 MIL/uL (ref 3.87–5.11)
RDW: 12.7 % (ref 11.5–15.5)
WBC: 12.1 10*3/uL — AB (ref 4.0–10.5)

## 2016-11-30 LAB — POCT PREGNANCY, URINE: Preg Test, Ur: NEGATIVE

## 2016-11-30 SURGERY — LAPAROTOMY
Anesthesia: General | Site: Abdomen

## 2016-11-30 MED ORDER — SUGAMMADEX SODIUM 200 MG/2ML IV SOLN
INTRAVENOUS | Status: AC
Start: 2016-11-30 — End: 2016-11-30
  Filled 2016-11-30: qty 2

## 2016-11-30 MED ORDER — HYDROMORPHONE HCL 1 MG/ML IJ SOLN: 1.0000 mg | INTRAMUSCULAR | Status: DC | PRN

## 2016-11-30 MED ORDER — LIDOCAINE HCL (CARDIAC) 20 MG/ML IV SOLN
INTRAVENOUS | Status: DC | PRN
  Administered 2016-11-30: 90 mg via INTRAVENOUS

## 2016-11-30 MED ORDER — HYDROMORPHONE HCL 1 MG/ML IJ SOLN
INTRAMUSCULAR | Status: AC
  Filled 2016-11-30: qty 1

## 2016-11-30 MED ORDER — KETOROLAC TROMETHAMINE 30 MG/ML IJ SOLN
INTRAMUSCULAR | Status: AC
  Administered 2016-11-30: 30 mg via INTRAVENOUS
  Filled 2016-11-30: qty 1

## 2016-11-30 MED ORDER — HYDROMORPHONE HCL 1 MG/ML IJ SOLN
0.5000 mg | INTRAMUSCULAR | Status: DC | PRN
  Administered 2016-11-30 (×2): 0.5 mg via INTRAVENOUS

## 2016-11-30 MED ORDER — ONDANSETRON HCL 4 MG/2ML IJ SOLN: 4.0000 mg | Freq: Four times a day (QID) | INTRAMUSCULAR | Status: DC | PRN

## 2016-11-30 MED ORDER — SODIUM CHLORIDE 0.9 % IV BOLUS (SEPSIS)
1000.0000 mL | Freq: Once | INTRAVENOUS | Status: AC
  Administered 2016-11-30: 1000 mL via INTRAVENOUS

## 2016-11-30 MED ORDER — ONDANSETRON HCL 4 MG/2ML IJ SOLN
INTRAMUSCULAR | Status: DC | PRN
  Administered 2016-11-30: 4 mg via INTRAVENOUS

## 2016-11-30 MED ORDER — OXYCODONE-ACETAMINOPHEN 5-325 MG PO TABS
1.0000 | ORAL_TABLET | ORAL | Status: DC | PRN
  Administered 2016-11-30: 2 via ORAL
  Administered 2016-12-01: 1 via ORAL
  Administered 2016-12-01: 2 via ORAL
  Administered 2016-12-01 – 2016-12-02 (×2): 1 via ORAL
  Filled 2016-11-30 (×2): qty 2
  Filled 2016-11-30 (×3): qty 1

## 2016-11-30 MED ORDER — PROPOFOL 10 MG/ML IV BOLUS
INTRAVENOUS | Status: DC | PRN
  Administered 2016-11-30: 180 mg via INTRAVENOUS

## 2016-11-30 MED ORDER — LIDOCAINE HCL (CARDIAC) 20 MG/ML IV SOLN
INTRAVENOUS | Status: AC
  Filled 2016-11-30: qty 5

## 2016-11-30 MED ORDER — HYDROMORPHONE HCL 2 MG PO TABS: 2.0000 mg | ORAL_TABLET | ORAL | 0 refills | Status: DC | PRN

## 2016-11-30 MED ORDER — ROCURONIUM BROMIDE 100 MG/10ML IV SOLN
INTRAVENOUS | Status: AC
  Filled 2016-11-30: qty 1

## 2016-11-30 MED ORDER — IOPAMIDOL (ISOVUE-300) INJECTION 61%
100.0000 mL | Freq: Once | INTRAVENOUS | Status: AC | PRN
  Administered 2016-11-30: 100 mL via INTRAVENOUS

## 2016-11-30 MED ORDER — HYDROMORPHONE HCL 2 MG PO TABS
2.0000 mg | ORAL_TABLET | Freq: Once | ORAL | Status: AC
  Administered 2016-11-30: 2 mg via ORAL
  Filled 2016-11-30: qty 1

## 2016-11-30 MED ORDER — HYDROMORPHONE HCL 1 MG/ML IJ SOLN
1.0000 mg | Freq: Once | INTRAMUSCULAR | Status: AC
  Administered 2016-11-30: 1 mg via INTRAVENOUS

## 2016-11-30 MED ORDER — PROPOFOL 10 MG/ML IV BOLUS
INTRAVENOUS | Status: AC
  Filled 2016-11-30: qty 20

## 2016-11-30 MED ORDER — IBUPROFEN 800 MG PO TABS: 800.0000 mg | ORAL_TABLET | Freq: Three times a day (TID) | ORAL | 0 refills | Status: DC

## 2016-11-30 MED ORDER — HYDROMORPHONE HCL 1 MG/ML IJ SOLN
1.0000 mg | Freq: Once | INTRAMUSCULAR | Status: AC
  Administered 2016-11-30: 1 mg via INTRAVENOUS
  Filled 2016-11-30: qty 1

## 2016-11-30 MED ORDER — KETOROLAC TROMETHAMINE 30 MG/ML IJ SOLN
30.0000 mg | Freq: Four times a day (QID) | INTRAMUSCULAR | Status: DC
  Administered 2016-11-30 – 2016-12-01 (×2): 30 mg via INTRAVENOUS
  Filled 2016-11-30 (×2): qty 1

## 2016-11-30 MED ORDER — KETOROLAC TROMETHAMINE 30 MG/ML IJ SOLN: 30.0000 mg | Freq: Four times a day (QID) | INTRAMUSCULAR | Status: DC

## 2016-11-30 MED ORDER — ROCURONIUM BROMIDE 100 MG/10ML IV SOLN
INTRAVENOUS | Status: DC | PRN
  Administered 2016-11-30: 20 mg via INTRAVENOUS
  Administered 2016-11-30: 30 mg via INTRAVENOUS

## 2016-11-30 MED ORDER — ACETAMINOPHEN 500 MG PO TABS
1000.0000 mg | ORAL_TABLET | Freq: Three times a day (TID) | ORAL | Status: DC
  Administered 2016-11-30: 1000 mg via ORAL
  Filled 2016-11-30: qty 2

## 2016-11-30 MED ORDER — MIDAZOLAM HCL 2 MG/2ML IJ SOLN
INTRAMUSCULAR | Status: DC | PRN
  Administered 2016-11-30: 2 mg via INTRAVENOUS

## 2016-11-30 MED ORDER — KETOROLAC TROMETHAMINE 30 MG/ML IJ SOLN
30.0000 mg | Freq: Once | INTRAMUSCULAR | Status: AC
  Administered 2016-11-30: 30 mg via INTRAMUSCULAR
  Filled 2016-11-30: qty 1

## 2016-11-30 MED ORDER — FENTANYL CITRATE (PF) 250 MCG/5ML IJ SOLN
INTRAMUSCULAR | Status: AC
Start: 2016-11-30 — End: 2016-11-30
  Filled 2016-11-30: qty 5

## 2016-11-30 MED ORDER — PROMETHAZINE HCL 25 MG/ML IJ SOLN: 6.2500 mg | INTRAMUSCULAR | Status: DC | PRN

## 2016-11-30 MED ORDER — KETOROLAC TROMETHAMINE 30 MG/ML IJ SOLN
30.0000 mg | INTRAMUSCULAR | Status: AC
  Administered 2016-11-30 (×2): 30 mg via INTRAVENOUS
  Filled 2016-11-30: qty 1

## 2016-11-30 MED ORDER — SUCCINYLCHOLINE CHLORIDE 20 MG/ML IJ SOLN
INTRAMUSCULAR | Status: DC | PRN
  Administered 2016-11-30: 140 mg via INTRAVENOUS

## 2016-11-30 MED ORDER — LACTATED RINGERS IV SOLN
INTRAVENOUS | Status: DC
  Administered 2016-11-30 – 2016-12-01 (×4): via INTRAVENOUS

## 2016-11-30 MED ORDER — SUGAMMADEX SODIUM 200 MG/2ML IV SOLN
INTRAVENOUS | Status: AC
  Filled 2016-11-30: qty 2

## 2016-11-30 MED ORDER — KETOROLAC TROMETHAMINE 30 MG/ML IJ SOLN
30.0000 mg | Freq: Once | INTRAMUSCULAR | Status: AC
  Administered 2016-11-30: 30 mg via INTRAVENOUS
  Filled 2016-11-30: qty 1

## 2016-11-30 MED ORDER — IOPAMIDOL (ISOVUE-300) INJECTION 61%
30.0000 mL | INTRAVENOUS | Status: AC
  Administered 2016-11-30 (×2): 30 mL via ORAL

## 2016-11-30 MED ORDER — ONDANSETRON HCL 4 MG/2ML IJ SOLN
INTRAMUSCULAR | Status: AC
  Filled 2016-11-30: qty 2

## 2016-11-30 MED ORDER — HYDROMORPHONE HCL 1 MG/ML IJ SOLN: 1.0000 mg | Freq: Once | INTRAMUSCULAR | Status: DC

## 2016-11-30 MED ORDER — HYDROMORPHONE HCL 1 MG/ML IJ SOLN: 0.2500 mg | INTRAMUSCULAR | Status: DC | PRN

## 2016-11-30 MED ORDER — LACTATED RINGERS IV SOLN: INTRAVENOUS | Status: DC

## 2016-11-30 MED ORDER — ONDANSETRON HCL 4 MG PO TABS: 4.0000 mg | ORAL_TABLET | Freq: Four times a day (QID) | ORAL | Status: DC | PRN

## 2016-11-30 MED ORDER — MIDAZOLAM HCL 2 MG/2ML IJ SOLN
INTRAMUSCULAR | Status: AC
  Filled 2016-11-30: qty 2

## 2016-11-30 MED ORDER — FENTANYL CITRATE (PF) 100 MCG/2ML IJ SOLN
INTRAMUSCULAR | Status: DC | PRN
  Administered 2016-11-30: 25 ug via INTRAVENOUS
  Administered 2016-11-30: 50 ug via INTRAVENOUS
  Administered 2016-11-30 (×3): 100 ug via INTRAVENOUS
  Administered 2016-11-30: 25 ug via INTRAVENOUS
  Administered 2016-11-30: 50 ug via INTRAVENOUS

## 2016-11-30 MED ORDER — FENTANYL CITRATE (PF) 250 MCG/5ML IJ SOLN
INTRAMUSCULAR | Status: AC
  Filled 2016-11-30: qty 5

## 2016-11-30 MED ORDER — DEXAMETHASONE SODIUM PHOSPHATE 10 MG/ML IJ SOLN
INTRAMUSCULAR | Status: AC
  Filled 2016-11-30: qty 1

## 2016-11-30 MED ORDER — KETOROLAC TROMETHAMINE 60 MG/2ML IM SOLN: 60.0000 mg | INTRAMUSCULAR | Status: DC

## 2016-11-30 MED ORDER — SUGAMMADEX SODIUM 200 MG/2ML IV SOLN
INTRAVENOUS | Status: DC | PRN
  Administered 2016-11-30: 160 mg via INTRAVENOUS

## 2016-11-30 MED ORDER — DEXAMETHASONE SODIUM PHOSPHATE 10 MG/ML IJ SOLN
INTRAMUSCULAR | Status: DC | PRN
  Administered 2016-11-30: 10 mg via INTRAVENOUS

## 2016-11-30 MED ORDER — HYDROMORPHONE HCL 1 MG/ML IJ SOLN
INTRAMUSCULAR | Status: AC
  Administered 2016-11-30: 0.5 mg via INTRAVENOUS
  Filled 2016-11-30: qty 1

## 2016-11-30 SURGICAL SUPPLY — 33 items
BLADE SURG 10 STRL SS (BLADE) ×2 IMPLANT
CANISTER SUCT 3000ML PPV (MISCELLANEOUS) ×5 IMPLANT
CLOTH BEACON ORANGE TIMEOUT ST (SAFETY) ×3 IMPLANT
CONT PATH 16OZ SNAP LID 3702 (MISCELLANEOUS) ×3 IMPLANT
DECANTER SPIKE VIAL GLASS SM (MISCELLANEOUS) ×1 IMPLANT
DRAPE WARM FLUID 44X44 (DRAPE) IMPLANT
DRSG OPSITE POSTOP 4X10 (GAUZE/BANDAGES/DRESSINGS) ×2 IMPLANT
DURAPREP 26ML APPLICATOR (WOUND CARE) ×4 IMPLANT
GAUZE SPONGE 4X4 16PLY XRAY LF (GAUZE/BANDAGES/DRESSINGS) ×2 IMPLANT
GLOVE BIO SURGEON STRL SZ 6.5 (GLOVE) ×2 IMPLANT
GLOVE BIO SURGEONS STRL SZ 6.5 (GLOVE) ×1
GLOVE BIOGEL PI IND STRL 7.0 (GLOVE) ×2 IMPLANT
GLOVE BIOGEL PI INDICATOR 7.0 (GLOVE) ×4
GOWN STRL REUS W/TWL LRG LVL3 (GOWN DISPOSABLE) ×9 IMPLANT
NS IRRIG 1000ML POUR BTL (IV SOLUTION) ×3 IMPLANT
PACK ABDOMINAL GYN (CUSTOM PROCEDURE TRAY) ×3 IMPLANT
PAD OB MATERNITY 4.3X12.25 (PERSONAL CARE ITEMS) ×3 IMPLANT
PENCIL SMOKE EVAC W/HOLSTER (ELECTROSURGICAL) ×1 IMPLANT
PROTECTOR NERVE ULNAR (MISCELLANEOUS) ×3 IMPLANT
SPONGE LAP 18X18 X RAY DECT (DISPOSABLE) ×2 IMPLANT
STAPLER VISISTAT 35W (STAPLE) IMPLANT
SUT PLAIN 2 0 XLH (SUTURE) ×2 IMPLANT
SUT VIC AB 0 CT1 18XCR BRD8 (SUTURE) IMPLANT
SUT VIC AB 0 CT1 27 (SUTURE) ×6
SUT VIC AB 0 CT1 27XBRD ANBCTR (SUTURE) IMPLANT
SUT VIC AB 0 CT1 36 (SUTURE) IMPLANT
SUT VIC AB 0 CT1 8-18 (SUTURE) ×3
SUT VIC AB 2-0 CT1 27 (SUTURE)
SUT VIC AB 2-0 CT1 TAPERPNT 27 (SUTURE) IMPLANT
SUT VIC AB 4-0 PS2 27 (SUTURE) ×2 IMPLANT
SUT VICRYL 0 TIES 12 18 (SUTURE) ×2 IMPLANT
TOWEL OR 17X24 6PK STRL BLUE (TOWEL DISPOSABLE) ×6 IMPLANT
TRAY FOLEY CATH SILVER 14FR (SET/KITS/TRAYS/PACK) ×3 IMPLANT

## 2016-11-30 NOTE — Anesthesia Postprocedure Evaluation (Addendum)
Anesthesia Post Note  Patient: Nicole Velasquez  Procedure(s) Performed: Procedure(s) (LRB): LAPAROTOMY (N/A)  Patient location during evaluation: PACU Anesthesia Type: General Level of consciousness: sedated Pain management: pain level controlled Vital Signs Assessment: post-procedure vital signs reviewed and stable Respiratory status: spontaneous breathing and respiratory function stable Cardiovascular status: stable Anesthetic complications: no        Last Vitals:  Vitals:   11/30/16 1715 11/30/16 1730  BP: (!) 142/93 138/88  Pulse: 92 89  Resp: 15 11  Temp:  36.8 C    Last Pain:  Vitals:   11/30/16 1730  TempSrc:   PainSc: Asleep   Pain Goal: Patients Stated Pain Goal: 0 (11/30/16 1458)               Raylei Losurdo DANIEL

## 2016-11-30 NOTE — Discharge Instructions (Signed)
Masa plvica (Pelvic Mass) Una masa plvica es un crecimiento anormal en la pelvis, que es la zona que se encuentra entre los huesos de las caderas. La pelvis incluye la vejiga y el recto en los hombres y las [place]mujeres, y, [place], el tero y los ovarios en las mujeres. CAUSAS Muchas cosas pueden causar una masa plvica, entre ellas:  Cncer.  Fibromas en el tero.  Quistes ovricos.  Infeccin.  Embarazo ectpicoMajel Homer. SIGNOS Y SNTOMAS Los sntomas de una masa plvica pueden incluir lo siguiente:  Clicos.  Nuseas.  Diarrea.  Grant RutsFiebre.  Vmitos.  Debilidad.  Dolor en la pelvis, en un costado o en la espalda.  Prdida de peso.  Estreimiento.  Problemas de hemorragias vaginales, por ejemplo:  Hemorragia vaginal leve o abundante, con o sin cogulos sanguneos.  Menstruacin irregular.  Dolor con Tax adviserla menstruacin.  Problemas relacionados con la orina, entre ellos:  Ganas frecuentes de Geographical information systems officerorinar.  Imposibilidad de vaciar la vejiga completamente.  Orinar muy pocas cantidades.  Dolor al Beatrix Shipperorinar.  Sangre en la orina. Algunas masas plvicas no causan sntomas. DIAGNSTICO Para hacer el diagnstico, el mdico tendr que obtener ms informacin sobre la masa. Pueden hacerle estudios o procedimientos, por ejemplo:  Anlisis de [place] Riversangre.  Radiografas.  Ecografa.  Tomografa computarizada.  Resonancia magntica.  Cipriano MileUna ciruga para examinar el interior del abdomen con cmaras (laparoscopia).  Una biopsia que se realiza con [place]una aguja o durante una laparoscopia o [place] ciruga. En algunos casos, lo que parece ser una masa plvica puede ser otra cosa, como una masa en uno de los rganos que se encuentran cerca de la pelvis, una infeccin (absceso) o tejido cicatricial (adherencias) que se form despus de [place] ciruga. TRATAMIENTO El tratamiento depender de la causa de la masa. INSTRUCCIONES PARA EL CUIDADO EN EL HOGAR Lo que deba hacer en su casa depender de la causa de  la masa. Siga las indicaciones del mdico. En general:  Concurra a todas las visitas de control como se lo haya indicado el mdico. Esto es importante.  Tome los medicamentos solamente como se lo haya indicado el mdico.  Respete las restricciones que el mdico le haya impuesto. SOLICITE ATENCIN MDICA SI:  Presenta nuevos sntomas. SOLICITE ATENCIN MDICA DE INMEDIATO SI:  Vomita sangre de color rojo brillante o una sustancia parecida a los granos de caf.  Observa sangre en la materia fecal, o las heces se tornan negras y de aspecto alquitranado.  La hemorragia vaginal es anormal o se vuelve ms abundante.  Tiene fiebre.  Se le forman hematomas o sangra con facilidad.  Siente un dolor repentino o ms intenso que no puede controlar con los United Parcelmedicamentos.  Siente debilidad progresiva o sufre un desmayo.  Siente que la masa se agrand repentinamente.  Tiene una distensin abdominal o plvica importante.  No puede orinar.  No puede defecar. Esta informacin no tiene Theme park managercomo fin reemplazar el consejo del mdico. Asegrese de hacerle al mdico cualquier pregunta que tenga. Document Released: 10/23/2008 Document Revised: 07/28/2014 Document Reviewed: 02/20/2014 Elsevier Interactive Patient Education  2017 [employer]

## 2016-11-30 NOTE — Progress Notes (Signed)
Communication with patient through interpreter

## 2016-11-30 NOTE — Anesthesia Procedure Notes (Signed)
Procedure Name: Intubation Date/Time: 11/30/2016 3:26 PM Performed by: Rica RecordsICKELTON, Rudine Rieger Pre-anesthesia Checklist: Patient identified, Patient being monitored, Timeout performed, Emergency Drugs available and Suction available Patient Re-evaluated:Patient Re-evaluated prior to inductionOxygen Delivery Method: Circle System Utilized Preoxygenation: Pre-oxygenation with 100% oxygen Intubation Type: IV induction, Cricoid Pressure applied and Rapid sequence Laryngoscope Size: Miller and 2 Grade View: Grade I Tube type: Oral Tube size: 7.0 mm Number of attempts: 1 Airway Equipment and Method: stylet Placement Confirmation: ETT inserted through vocal cords under direct vision,  positive ETCO2 and breath sounds checked- equal and bilateral Secured at: 21 cm Tube secured with: Tape Dental Injury: Teeth and Oropharynx as per pre-operative assessment

## 2016-11-30 NOTE — MAU Provider Note (Signed)
History     CSN: [id number]  Arrival date and time: 11/30/16 0037   None     Chief Complaint  Patient presents with  . Abdominal Pain   HPI   Spanish interpretor used Ms.[address]anta Clara Roselle LocusDominguez is a 41 y.o. female [phone] here in MAU with acute onset of worsening abdominal pain. The patient has a history of a large pelvic mass and is scheduled for a laparotomy with removal of pelvic mass on 5/17. The patient was sitting at home "celebrating mothers day" when she stated having severe RLQ pain. The pain is worse than the pain she has had in the past related to the mass. She started spotting 3 days ago and feels she started her menstrual cycle.   OB History    Gravida Para Term Preterm AB Living   3 3 3     3    SAB TAB Ectopic Multiple Live Births                  Past Medical History:  Diagnosis Date  . Hyperlipemia     History reviewed. No pertinent surgical history.  Family History  Problem Relation Age of Onset  . Diabetes Father     Social History  Substance Use Topics  . Smoking status: Never Smoker  . Smokeless tobacco: Never Used  . Alcohol use Yes     Comment: weekends    Allergies: No Known Allergies  Prescriptions Prior to Admission  Medication Sig Dispense Refill Last Dose  . etonogestrel (NEXPLANON) 68 MG IMPL implant 1 each by Subdermal route once.   Taking  . ibuprofen (ADVIL,MOTRIN) 200 MG tablet Take 400 mg by mouth every 8 (eight) hours as needed for mild pain.       Results for orders placed or performed during the hospital encounter of 11/30/16 (from the past 48 hour(s))  CBC with Differential     Status: Abnormal   Collection Time: 11/30/16 12:40 AM  Result Value Ref Range   WBC 12.1 (H) 4.0 - 10.5 K/uL   RBC 4.82 3.87 - 5.11 MIL/uL   Hemoglobin 14.0 12.0 - 15.0 g/dL   HCT 78.441.2 69.636.0 - 29.546.0 %   MCV 85.5 78.0 - 100.0 fL   MCH 29.0 26.0 - 34.0 pg   MCHC 34.0 30.0 - 36.0 g/dL   RDW 28.412.7 13.211.5 - 44.015.5 %   Platelets 280 150 - 400 K/uL   Neutrophils Relative % 40 %   Neutro Abs 4.8 1.7 - 7.7 K/uL   Lymphocytes Relative 48 %   Lymphs Abs 5.8 (H) 0.7 - 4.0 K/uL   Monocytes Relative 9 %   Monocytes Absolute 1.1 (H) 0.1 - 1.0 K/uL   Eosinophils Relative 4 %   Eosinophils Absolute 0.5 0.0 - 0.7 K/uL   Basophils Relative 0 %   Basophils Absolute 0.0 0.0 - 0.1 K/uL  Type and screen [facility]     Status: None   Collection Time: 11/30/16 12:45 AM  Result Value Ref Range   ABO/RH(D) B POS    Antibody Screen NEG    Sample Expiration 12/03/2016   Urinalysis, Routine w reflex microscopic     Status: Abnormal   Collection Time: 11/30/16  1:30 AM  Result Value Ref Range   Color, Urine STRAW (A) YELLOW   APPearance CLEAR CLEAR   Specific Gravity, Urine 1.006 1.005 - 1.030   pH 6.0 5.0 - 8.0   Glucose, UA NEGATIVE NEGATIVE mg/dL   Hgb  urine dipstick NEGATIVE NEGATIVE   Bilirubin Urine NEGATIVE NEGATIVE   Ketones, ur NEGATIVE NEGATIVE mg/dL   Protein, ur NEGATIVE NEGATIVE mg/dL   Nitrite NEGATIVE NEGATIVE   Leukocytes, UA NEGATIVE NEGATIVE  Pregnancy, urine POC     Status: None   Collection Time: 11/30/16  1:34 AM  Result Value Ref Range   Preg Test, Ur NEGATIVE NEGATIVE    Comment:        THE SENSITIVITY OF THIS METHODOLOGY IS >24 mIU/mL    US Pelvis Complete  Result Date: 11/30/2016 CLINICAL DATA:  41 y/o  F; abdominal pain, evaluate pelvic mass. EXAM: TRANSABDOMINAL AND TRANSVAGINAL ULTRASOUND OF PELVIS TECHNIQUE: Both transabdominal and transvaginal ultrasound examinations of the pelvis were performed. Transabdominal technique was performed for global imaging of the pelvis including uterus, ovaries, adnexal regions, and pelvic cul-de-sac. It was necessary to proceed with endovaginal exam following the transabdominal exam to visualize the adnexa. COMPARISON:  01/03/2016 pelvic ultrasound FINDINGS: Uterus Measurements: 8.6 x 3.6 x 4.8 cm. No fibroids or other mass visualized. Endometrium Thickness: 7.3  mm.  No focal abnormality visualized. Right ovary 19.5 x 11.5 x 16.4 cystic pelvic mass, probably arising from right adnexal, increased in size in comparison with prior pelvic ultrasound or it measured 11.1 x 6.3 x 15.1 cm. No solid component identified. Ovary not visualized. Left ovary Ovary not visualized. Other findings No abnormal free fluid. IMPRESSION: Interval increase in size of cystic pelvic mass up to 19.5 cm. This can be further evaluated with pelvic MRI and surgical consultation is recommended. Electronically Signed   By: Mitzi Hansen M.D.   On: 11/30/2016 01:49   Review of Systems  Constitutional: Negative for fever.  Gastrointestinal: Positive for abdominal distention, abdominal pain and nausea. Negative for vomiting.  Genitourinary: Positive for vaginal bleeding (Spotting).   Physical Exam   Blood pressure 117/60, pulse 78, temperature 97.6 F (36.4 C), temperature source Oral, resp. rate 20, last menstrual period 11/28/2016, SpO2 99 %.  Physical Exam  Constitutional: She is oriented to person, place, and time. She appears well-developed and well-nourished.  Non-toxic appearance. She has a sickly appearance. She appears ill. She appears distressed.  Respiratory: Effort normal.  GI: She exhibits distension and mass (RUQ, RLQ). There is tenderness. There is guarding. There is no rigidity and no rebound.  Neurological: She is alert and oriented to person, place, and time.  Skin: Skin is warm. She is diaphoretic.  Psychiatric: Her behavior is normal.    MAU Course  Procedures  None  MDM  NS  CBC, Type and screen Least meal 1-2 hours ago Dilaudid 1 mg PO, an additional dose of 1 mg IV given prior to [place] Toradol 30 mg IV Bedside pelvic US ordered for possible torsion.  Discussed [place] in detail with Dr. Macon Large, ok to dc patient with patient management at home.  2 mg PO dilaudid given prior to DC, along with an additional dose of toradol 30 mg IM  Discussed in  detail plan of care with the patient and family using video spanish interpretor.    Assessment and Plan   A:  1. Abdominal pain   2. Pelvic mass in female     P:  Discharge home in stable condition Strict return precautions Rx: Dilaudid, Ibuprofen, tylenol Surgery is scheduled for 5/17   Venia Carbon I, NP 11/30/2016 11:13 AM

## 2016-11-30 NOTE — MAU Note (Signed)
Pt was seen here during the night and diagnosed with a pelvic mass. Pt states she has been taking the pain medicine at home but it is not helping with the pain. Pt states the pain started getting a lot worse around 6am. Pt states she took PO dilaudid last at 0630. Per EMS, pt had dilaudid IV while in ambulance. Pt denies vaginal bleeding and discharge.

## 2016-11-30 NOTE — Op Note (Signed)
Laparotomy Procedure Note  Indications: The patient is a 41 y.o. female with large right ovarian cystic mass with acute pain.  Pre-operative Diagnosis: Ovarian cystic mass  Post-operative Diagnosis: right adnexal torsion and ovarian serous cyst  Surgeon: Scheryl DarterJames Arnold   Assistants: Dr. Jaynie CollinsUgonna Anyanwu  Anesthesia: General endotracheal anesthesia  ASA Class: 1  Procedure Details  The patient was seen in the Holding Room. The risks, benefits, complications, treatment options, and expected outcomes were discussed with the patient. The possibilities of reaction to medication, pulmonary aspiration, perforation of viscus, bleeding, recurrent infection, the need for additional procedures, failure to diagnose a condition, and creating a complication requiring transfusion or operation were discussed with the patient. The patient concurred with the proposed plan, giving informed consent. The patient was taken to the Operating Room, identified as [facility] and the procedure verified as exploratory laparotomy. A Time Out was held and the above information confirmed.  After induction of general anesthesia, the patient was placed in dorsal supine position where she was prepped, draped, and catheterized in the normal, sterile fashion.  A Pfannenstiel skin incision was made with #10 blade. Incision was carried down to the fascia and the incision was extended transversely with curved Mayo scissors. The rectus muscles were separated and underlying peritoneum was entered. Incision was extended to reveal the peritoneal cavity. The large ovarian mass was identified. The serosa was smooth. There were no adhesions. The mass was punctured and suction was used to evacuate copious amount of clear fluid. 2 L of fluid was removed. The right adnexal mass was then elevated confirming that cyst was ovarian. The right adnexal pedicle was seen to have torsion. Kelly clamps were placed across the adnexal pedicle  including the infundibulopelvic ligament and the right fallopian tube. The mass was excised and the adnexal pedicle was ligated with double ligatures of 0 Vicryl. Stasis was seen. The pelvis was inspected and there appeared to be no involvement of the ureters. The left adnexa appeared normal except for a 2 cm cyst which was also drained and clear fluid was expressed. All packs were removed and the peritoneum was closed with running suture with 0 Vicryl. Stasis was seen prior to closure of the fascia with 0 Vicryl. Skin incision was irrigated and hemostasis was assured. Interrupted subcutaneous tenia sutures with 20 plain gut was placed. Skin was closed with a running suture with 4-0 Vicryl. Sterile dressing was applied. Patient tolerated procedure well without, case. She was brought in stable condition to PACU.      Instrument, sponge, and needle counts were correct prior to abdominal closure and at the conclusion of the case.    Estimated Blood Loss:  less than 50 mL         Drains: Foley catheter         Total IV Fluids: 1100 mL         Specimens: Right ovary and fallopian tube              Complications:  None; patient tolerated the procedure well.         Disposition: PACU - hemodynamically stable.         Condition: stable  Adam PhenixArnold, James G, MD 11/30/2016 4:43 PM

## 2016-11-30 NOTE — MAU Note (Addendum)
Having surgery May 17 th for ovarian cyst.  Started having right lower abd pain an hour ago.  Spotting yesterday and today.  Last intercourse yesterday.

## 2016-11-30 NOTE — Anesthesia Preprocedure Evaluation (Addendum)
Anesthesia Evaluation  Patient identified by MRN, date of birth, ID band Patient awake    Reviewed: Allergy & Precautions, NPO status , Patient's Chart, lab work & pertinent test results  History of Anesthesia Complications Negative for: history of anesthetic complications  Airway Mallampati: II  TM Distance: >3 FB Neck ROM: Full    Dental no notable dental hx. (+) Dental Advisory Given   Pulmonary neg pulmonary ROS,    Pulmonary exam normal        Cardiovascular negative cardio ROS Normal cardiovascular exam     Neuro/Psych negative neurological ROS  negative psych ROS   GI/Hepatic negative GI ROS, Neg liver ROS,   Endo/Other  negative endocrine ROS  Renal/GU negative Renal ROS  negative genitourinary   Musculoskeletal negative musculoskeletal ROS (+)   Abdominal   Peds negative pediatric ROS (+)  Hematology negative hematology ROS (+)   Anesthesia Other Findings   Reproductive/Obstetrics negative OB ROS                            Anesthesia Physical Anesthesia Plan  ASA: III  Anesthesia Plan: General   Post-op Pain Management:    Induction: Intravenous, Rapid sequence and Cricoid pressure planned  Airway Management Planned: Oral ETT  Additional Equipment:   Intra-op Plan:   Post-operative Plan: Extubation in OR  Informed Consent: I have reviewed the patients History and Physical, chart, labs and discussed the procedure including the risks, benefits and alternatives for the proposed anesthesia with the patient or authorized representative who has indicated his/her understanding and acceptance.   Dental advisory given  Plan Discussed with: CRNA, Anesthesiologist and Surgeon  Anesthesia Plan Comments: (Interpreter was used for the interview)       Anesthesia Quick Evaluation

## 2016-11-30 NOTE — Transfer of Care (Signed)
Immediate Anesthesia Transfer of Care Note  Patient: Nicole Velasquez  Procedure(s) Performed: Procedure(s): LAPAROTOMY (N/A)  Patient Location: PACU  Anesthesia Type:General  Level of Consciousness: awake, alert  and oriented  Airway & Oxygen Therapy: Patient Spontanous Breathing  Post-op Assessment: Report given to RN and Post -op Vital signs reviewed and stable  Post vital signs: Reviewed and stable  Last Vitals:  Vitals:   11/30/16 1456 11/30/16 1630  BP: 128/83   Pulse: 82   Resp: 20   Temp: 37 C (P) 36.8 C    Last Pain:  Vitals:   11/30/16 1458  TempSrc:   PainSc: 9       Patients Stated Pain Goal: 0 (11/30/16 1458)  Complications: No apparent anesthesia complications

## 2016-11-30 NOTE — MAU Provider Note (Signed)
History     CSN: [id number]  Arrival date and time: 11/30/16 0945   First Provider Initiated Contact with Patient 11/30/16 1000      Chief Complaint  Patient presents with  . Abdominal Pain   Spanish Interpreter - Marta used for examination  Nicole Velasquez is a 41 yo [phone] non-pregnant female presenting by EMS for continued severe RLQ pain that started suddenly yesterday while "celebrating Mother's Day with family".  She vomited last night and has not been able to eat today. She took Dilaudid with no relief. She received Fentanyl 2 mcg while on EMS with minimal relief; pain rated 8/10.  She is known to have a large pelvic mass and is scheduled to have it surgically removed on 12/04/2016 by Dr. Debroah Loop.   Abdominal Pain  This is a chronic problem. The current episode started yesterday. The onset quality is sudden. The problem occurs intermittently. The problem has been rapidly worsening. The pain is located in the RLQ. The pain is at a severity of 8/10. The pain is moderate. The quality of the pain is sharp and colicky. The abdominal pain radiates to the LLQ, RUQ and back. Associated symptoms include anorexia, nausea and vomiting. The pain is aggravated by movement and vomiting. The pain is relieved by nothing. She has tried oral narcotic analgesics for the symptoms. The treatment provided mild relief. Prior diagnostic workup includes ultrasound.      Past Medical History:  Diagnosis Date  . Hyperlipemia     Past Surgical History:  Procedure Laterality Date  . NO PAST SURGERIES      Family History  Problem Relation Age of Onset  . Diabetes Father     Social History  Substance Use Topics  . Smoking status: Never Smoker  . Smokeless tobacco: Never Used  . Alcohol use Yes     Comment: weekends    Allergies: No Known Allergies  Prescriptions Prior to Admission  Medication Sig Dispense Refill Last Dose  . HYDROmorphone (DILAUDID) 2 MG tablet Take 1-2 tablets  (2-4 mg total) by mouth every 4 (four) hours as needed for severe pain. 40 tablet 0 11/30/2016 at 0630  . ibuprofen (ADVIL,MOTRIN) 800 MG tablet Take 1 tablet (800 mg total) by mouth 3 (three) times daily. 21 tablet 0 11/29/2016 at Unknown time  . etonogestrel (NEXPLANON) 68 MG IMPL implant 1 each by Subdermal route once.   Continuous    Review of Systems  Constitutional: Positive for appetite change.  HENT: Negative.   Eyes: Negative.   Respiratory: Negative.   Cardiovascular: Negative.   Gastrointestinal: Positive for abdominal distention, abdominal pain, anorexia, nausea and vomiting.  Endocrine: Negative.   Genitourinary: Positive for pelvic pain (RLQ pain like contractions with intermittent lower abdominal pressure).  Musculoskeletal: Positive for back pain.   Physical Exam   Blood pressure 126/78, pulse 77, temperature 98.1 F (36.7 C), temperature source Oral, resp. rate 18, height 5\' 4"  (1.626 m), weight 76.2 kg (168 lb), last menstrual period 11/28/2016, SpO2 99 %.  Physical Exam  Constitutional: She is oriented to person, place, and time. She appears well-developed and well-nourished. She appears distressed.  HENT:  Head: Normocephalic.  Eyes: Pupils are equal, round, and reactive to light.  Neck: Normal range of motion.  Cardiovascular: Normal rate, regular rhythm and normal heart sounds.   Respiratory: Effort normal and breath sounds normal.  GI: Soft.  Hypoactive BS  Genitourinary:  Genitourinary Comments: deferred  Musculoskeletal: Normal range of motion.  Neurological:  She is alert and oriented to person, place, and time. She has normal reflexes.  Skin: Skin is warm and dry.  Psychiatric: She has a normal mood and affect. Her behavior is normal. Judgment and thought content normal.    Results for orders placed or performed during the hospital encounter of 11/30/16 (from the past 24 hour(s))  CBC with Differential     Status: Abnormal   Collection Time: 11/30/16  12:40 AM  Result Value Ref Range   WBC 12.1 (H) 4.0 - 10.5 K/uL   RBC 4.82 3.87 - 5.11 MIL/uL   Hemoglobin 14.0 12.0 - 15.0 g/dL   HCT 86.541.2 78.436.0 - 69.646.0 %   MCV 85.5 78.0 - 100.0 fL   MCH 29.0 26.0 - 34.0 pg   MCHC 34.0 30.0 - 36.0 g/dL   RDW 29.512.7 28.411.5 - 13.215.5 %   Platelets 280 150 - 400 K/uL   Neutrophils Relative % 40 %   Neutro Abs 4.8 1.7 - 7.7 K/uL   Lymphocytes Relative 48 %   Lymphs Abs 5.8 (H) 0.7 - 4.0 K/uL   Monocytes Relative 9 %   Monocytes Absolute 1.1 (H) 0.1 - 1.0 K/uL   Eosinophils Relative 4 %   Eosinophils Absolute 0.5 0.0 - 0.7 K/uL   Basophils Relative 0 %   Basophils Absolute 0.0 0.0 - 0.1 K/uL  [facility] and screen [facility]      Status: None   Collection Time: 11/30/16 12:45 AM  Result Value Ref Range   ABO/RH(D) B POS    Antibody Screen NEG    Sample Expiration 12/03/2016   Urinalysis, Routine w reflex microscopic     Status: Abnormal   Collection Time: 11/30/16  1:30 AM  Result Value Ref Range   Color, Urine STRAW (A) YELLOW   APPearance CLEAR CLEAR   Specific Gravity, Urine 1.006 1.005 - 1.030   pH 6.0 5.0 - 8.0   Glucose, UA NEGATIVE NEGATIVE mg/dL   Hgb urine dipstick NEGATIVE NEGATIVE   Bilirubin Urine NEGATIVE NEGATIVE   Ketones, ur NEGATIVE NEGATIVE mg/dL   Protein, ur NEGATIVE NEGATIVE mg/dL   Nitrite NEGATIVE NEGATIVE   Leukocytes, UA NEGATIVE NEGATIVE  Pregnancy, urine POC     Status: None   Collection Time: 11/30/16  1:34 AM  Result Value Ref Range   Preg Test, Ur NEGATIVE NEGATIVE   [place] Pelvis Complete  Result Date: 11/30/2016 CLINICAL DATA:  41 y/o  F; abdominal pain, evaluate pelvic mass. EXAM: TRANSABDOMINAL AND TRANSVAGINAL ULTRASOUND OF PELVIS TECHNIQUE: Both transabdominal and transvaginal ultrasound examinations of the pelvis were performed. Transabdominal technique was performed for global imaging of the pelvis including uterus, ovaries, adnexal regions, and pelvic cul-de-sac. It was necessary to proceed with  endovaginal exam following the transabdominal exam to visualize the adnexa. COMPARISON:  01/03/2016 pelvic ultrasound FINDINGS: Uterus Measurements: 8.6 x 3.6 x 4.8 cm. No fibroids or other mass visualized. Endometrium Thickness: 7.3 mm.  No focal abnormality visualized. Right ovary 19.5 x 11.5 x 16.4 cystic pelvic mass, probably arising from right adnexal, increased in size in comparison with prior pelvic ultrasound or it measured 11.1 x 6.3 x 15.1 cm. No solid component identified. Ovary not visualized. Left ovary Ovary not visualized. Other findings No abnormal free fluid. IMPRESSION: Interval increase in size of cystic pelvic mass up to 19.5 cm. This can be further evaluated with pelvic MRI and surgical consultation is recommended. Electronically Signed   By: Mitzi HansenLance  Furusawa-Stratton M.D.   On: 11/30/2016 01:49  Ct Abdomen Pelvis W Contrast  Result Date: 11/30/2016 CLINICAL DATA:  Severe abdominal pain.  Known large ovarian lesion. EXAM: CT ABDOMEN AND PELVIS WITH CONTRAST TECHNIQUE: Multidetector CT imaging of the abdomen and pelvis was performed using the standard protocol following bolus administration of intravenous contrast. CONTRAST:  ISOVUE-300 IOPAMIDOL (ISOVUE-300) INJECTION 61% COMPARISON:  CT scan 11/15/2015 and pelvic ultrasound from today. FINDINGS: Lower chest: The lung bases are clear of acute process. No pleural effusion or pulmonary lesions. The heart is normal in size. No pericardial effusion. The distal esophagus and aorta are unremarkable. Hepatobiliary: No focal hepatic lesions or intrahepatic biliary dilatation. The gallbladder is normal. No common bile duct dilatation. Pancreas: No mass, inflammation or ductal dilatation. Spleen: Normal size.  No focal lesions. Adrenals/Urinary Tract: The adrenal glands and kidneys are unremarkable. No ureteral or bladder calculi. No bladder mass. Stomach/Bowel: The stomach, duodenum, small bowel and colon are unremarkable. No acute inflammatory  changes, mass lesions or obstructive findings. The terminal ileum appears normal. The appendix is normal. Vascular/Lymphatic: The aorta and branch vessels are normal. The major venous structures are patent. There is a retroaortic left renal vein. No mesenteric or retroperitoneal lymphadenopathy. Reproductive: There is a very large cystic lesion, most likely associated with the right ovary, which is extending up out of the pelvis and into the mid abdomen. It measures 22 x 12 x 15.5 cm. No nodularity or septations. The lesion appears in capsulated without worrisome areas of enhancement. 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 The left ovary is normal except for a small cyst. The uterus is normal. Other: No pelvic lymphadenopathy. No inguinal adenopathy. Small periumbilical abdominal wall hernia containing fat. No subcutaneous lesions. Musculoskeletal: No significant bony findings. IMPRESSION: 1. 22 cm cystic lesion in the abdomen/pelvis likely arising from the right ovary. This has increased in size since the prior CT scan from 1 year ago. Mild mass effect on the bowel which is displaced laterally. 2. 2.5 cm simple appearing left ovarian cyst. 3. No abdominal/pelvic lymphadenopathy or omental or peritoneal surface disease. Electronically Signed   By: Rudie Meyer M.D.   On: 11/30/2016 13:59   MAU Course  Procedures  MDM Toradol 30 mg IV - no relief Abdominal Pelvic CT scan EKG - NSR with abnormal T wave / per Dr. Durene Fruits on-call cardiology MD "non-specific T wave abnormalities. No f/u needed"  Assessment and Plan  41 yo G3P3003  Pelvic Mass Abdominal Pain, acute Patient desires surgical management with laparotomy and removal of the pelvic mass.  The risks of surgery were discussed in detail with the patient including but not limited to: bleeding which may require transfusion or reoperation; infection which may require prolonged hospitalization or re-hospitalization and antibiotic therapy; injury to  bowel, bladder, ureters and major vessels or other surrounding organs; need for additional procedures including laparotomy; thromboembolic phenomenon, incisional problems and other postoperative or anesthesia complications.  Patient was told that the likelihood that her condition and symptoms will be treated effectively with this surgical management was very high; the postoperative expectations were also discussed in detail. The patient also understands the alternative treatment options which were discussed in full. All questions were answered.  Prep for surgery today / Dr. Debroah Loop assumed care of patient  Raelyn Mora MSN, CNM 11/30/2016, 10:14 AM   Attestation of Attending Supervision of Advanced Practitioner (CNM/NP/PA): Evaluation and management procedures were performed by the Advanced Practitioner under my supervision and collaboration. I have reviewed the Advanced Practitioner's note and chart, and  I agree with the management and plan.  Scheryl Darter MD

## 2016-12-01 ENCOUNTER — Encounter (HOSPITAL_COMMUNITY): Payer: Self-pay | Admitting: Obstetrics & Gynecology

## 2016-12-01 LAB — CBC
HEMATOCRIT: 35.7 % — AB (ref 36.0–46.0)
Hemoglobin: 12.2 g/dL (ref 12.0–15.0)
MCH: 29.7 pg (ref 26.0–34.0)
MCHC: 34.2 g/dL (ref 30.0–36.0)
MCV: 86.9 fL (ref 78.0–100.0)
PLATELETS: 247 10*3/uL (ref 150–400)
RBC: 4.11 MIL/uL (ref 3.87–5.11)
RDW: 13 % (ref 11.5–15.5)
WBC: 13.4 10*3/uL — ABNORMAL HIGH (ref 4.0–10.5)

## 2016-12-01 MED ORDER — IBUPROFEN 800 MG PO TABS
800.0000 mg | ORAL_TABLET | Freq: Three times a day (TID) | ORAL | Status: DC
  Administered 2016-12-01 – 2016-12-02 (×3): 800 mg via ORAL
  Filled 2016-12-01 (×3): qty 1

## 2016-12-01 MED ORDER — SIMETHICONE 80 MG PO CHEW
80.0000 mg | CHEWABLE_TABLET | Freq: Three times a day (TID) | ORAL | Status: DC | PRN
  Administered 2016-12-02: 80 mg via ORAL
  Filled 2016-12-01: qty 1

## 2016-12-01 NOTE — Plan of Care (Signed)
Problem: Bowel/Gastric: Goal: Gastrointestinal status for postoperative course will improve Outcome: Progressing Pt. Tolerating regular diet and passing flatus. No reports or s/s of n/v. Will monitor.   Problem: Education: Goal: Knowledge of the prescribed therapeutic regimen will improve Outcome: Progressing Pt. Off IV meds and tolerating PO ibuprofen and PRN percocet with adequate pain relief.   Problem: Urinary Elimination: Goal: Ability to reestablish a normal urinary elimination pattern will improve Outcome: Progressing Pt. Voiding spontaneously without complications.

## 2016-12-01 NOTE — H&P (Signed)
History   CSN: [id number]  Arrival date and time: 11/30/16 0945   First Provider Initiated Contact with Patient 11/30/16 1000         Chief Complaint  Patient presents with  . Abdominal Pain   Spanish Interpreter - Marta used for examination  Ms. Nicole Velasquez is a 40 yo G3P3003 non-pregnant female presenting by EMS for continued severe RLQ pain that started suddenly yesterday while "celebrating Mother's Day with family".  She vomited last night and has not been able to eat today. She took Dilaudid with no relief. She received Fentanyl 2 mcg while on EMS with minimal relief; pain rated 8/10.  She is known to have a large pelvic mass and is scheduled to have it surgically removed on 12/04/2016 by Dr. Arnold.   Abdominal Pain  This is a chronic problem. The current episode started yesterday. The onset quality is sudden. The problem occurs intermittently. The problem has been rapidly worsening. The pain is located in the RLQ. The pain is at a severity of 8/10. The pain is moderate. The quality of the pain is sharp and colicky. The abdominal pain radiates to the LLQ, RUQ and back. Associated symptoms include anorexia, nausea and vomiting. The pain is aggravated by movement and vomiting. The pain is relieved by nothing. She has tried oral narcotic analgesics for the symptoms. The treatment provided mild relief. Prior diagnostic workup includes ultrasound.          Past Medical History:  Diagnosis Date  . Hyperlipemia          Past Surgical History:  Procedure Laterality Date  . NO PAST SURGERIES           Family History  Problem Relation Age of Onset  . Diabetes Father            Social History   Substance Use Topics   . Smoking status: Never Smoker   . Smokeless tobacco: Never Used   . Alcohol use Yes     Comment: weekends     Allergies: No Known Allergies         Prescriptions Prior to Admission  Medication Sig Dispense Refill Last  Dose  . HYDROmorphone (DILAUDID) 2 MG tablet Take 1-2 tablets (2-4 mg total) by mouth every 4 (four) hours as needed for severe pain. 40 tablet 0 11/30/2016 at 0630  . ibuprofen (ADVIL,MOTRIN) 800 MG tablet Take 1 tablet (800 mg total) by mouth 3 (three) times daily. 21 tablet 0 11/29/2016 at Unknown time  . etonogestrel (NEXPLANON) 68 MG IMPL implant 1 each by Subdermal route once.   Continuous    Review of Systems  Constitutional: Positive for appetite change.  HENT: Negative.   Eyes: Negative.   Respiratory: Negative.   Cardiovascular: Negative.   Gastrointestinal: Positive for abdominal distention, abdominal pain, anorexia, nausea and vomiting.  Endocrine: Negative.   Genitourinary: Positive for pelvic pain (RLQ pain like contractions with intermittent lower abdominal pressure).  Musculoskeletal: Positive for back pain.   Physical Exam   Blood pressure 126/78, pulse 77, temperature 98.1 F (36.7 C), temperature source Oral, resp. rate 18, height 5' 4" (1.626 m), weight 76.2 kg (168 lb), last menstrual period 11/28/2016, SpO2 99 %.  Physical Exam  Constitutional: She is oriented to person, place, and time. She appears well-developed and well-nourished. She appears distressed.  HENT:  Head: Normocephalic.  Eyes: Pupils are equal, round, and reactive to light.  Neck: Normal range of motion.  Cardiovascular: Normal rate, regular   rhythm and normal heart sounds.   Respiratory: Effort normal and breath sounds normal.  GI: Soft.  Hypoactive BS  Genitourinary:  Genitourinary Comments: deferred  Musculoskeletal: Normal range of motion.  Neurological: She is alert and oriented to person, place, and time. She has normal reflexes.  Skin: Skin is warm and dry.  Psychiatric: She has a normal mood and affect. Her behavior is normal. Judgment and thought content normal.    LabResultsLast24Hours       Results for orders placed or performed during the hospital encounter of  11/30/16 (from the past 24 hour(s))  CBC with Differential     Status: Abnormal   Collection Time: 11/30/16 12:40 AM  Result Value Ref Range   WBC 12.1 (H) 4.0 - 10.5 K/uL   RBC 4.82 3.87 - 5.11 MIL/uL   Hemoglobin 14.0 12.0 - 15.0 g/dL   HCT 41.2 36.0 - 46.0 %   MCV 85.5 78.0 - 100.0 fL   MCH 29.0 26.0 - 34.0 pg   MCHC 34.0 30.0 - 36.0 g/dL   RDW 12.7 11.5 - 15.5 %   Platelets 280 150 - 400 K/uL   Neutrophils Relative % 40 %   Neutro Abs 4.8 1.7 - 7.7 K/uL   Lymphocytes Relative 48 %   Lymphs Abs 5.8 (H) 0.7 - 4.0 K/uL   Monocytes Relative 9 %   Monocytes Absolute 1.1 (H) 0.1 - 1.0 K/uL   Eosinophils Relative 4 %   Eosinophils Absolute 0.5 0.0 - 0.7 K/uL   Basophils Relative 0 %   Basophils Absolute 0.0 0.0 - 0.1 K/uL  Type and screen [facility] [place]     Status: None   Collection Time: 11/30/16 12:45 AM  Result Value Ref Range   ABO/RH(D) B POS    Antibody Screen NEG    Sample Expiration 12/03/2016   Urinalysis, Routine w reflex microscopic     Status: Abnormal   Collection Time: 11/30/16  1:30 AM  Result Value Ref Range   Color, Urine STRAW (A) YELLOW   APPearance CLEAR CLEAR   Specific Gravity, Urine 1.006 1.005 - 1.030   pH 6.0 5.0 - 8.0   Glucose, UA NEGATIVE NEGATIVE mg/dL   Hgb urine dipstick NEGATIVE NEGATIVE   Bilirubin Urine NEGATIVE NEGATIVE   Ketones, ur NEGATIVE NEGATIVE mg/dL   Protein, ur NEGATIVE NEGATIVE mg/dL   Nitrite NEGATIVE NEGATIVE   Leukocytes, UA NEGATIVE NEGATIVE  Pregnancy, urine POC     Status: None   Collection Time: 11/30/16  1:34 AM  Result Value Ref Range   Preg Test, Ur NEGATIVE NEGATIVE      ImagingResults(Last48hours)  [place] Pelvis Complete  Result Date: 11/30/2016 CLINICAL DATA:  40 y/o  F; abdominal pain, evaluate pelvic mass. EXAM: TRANSABDOMINAL AND TRANSVAGINAL ULTRASOUND OF PELVIS TECHNIQUE: Both transabdominal and transvaginal ultrasound examinations of the pelvis  were performed. Transabdominal technique was performed for global imaging of the pelvis including uterus, ovaries, adnexal regions, and pelvic cul-de-sac. It was necessary to proceed with endovaginal exam following the transabdominal exam to visualize the adnexa. COMPARISON:  01/03/2016 pelvic ultrasound FINDINGS: Uterus Measurements: 8.6 x 3.6 x 4.8 cm. No fibroids or other mass visualized. Endometrium Thickness: 7.3 mm.  No focal abnormality visualized. Right ovary 19.5 x 11.5 x 16.4 cystic pelvic mass, probably arising from right adnexal, increased in size in comparison with prior pelvic ultrasound or it measured 11.1 x 6.3 x 15.1 cm. No solid component identified. Ovary not visualized. Left ovary Ovary not visualized.   Other findings No abnormal free fluid. IMPRESSION: Interval increase in size of cystic pelvic mass up to 19.5 cm. This can be further evaluated with pelvic MRI and surgical consultation is recommended. Electronically Signed   By: Lance  Furusawa-Stratton M.D.   On: 11/30/2016 01:49   Ct Abdomen Pelvis W Contrast  Result Date: 11/30/2016 CLINICAL DATA:  Severe abdominal pain.  Known large ovarian lesion. EXAM: CT ABDOMEN AND PELVIS WITH CONTRAST TECHNIQUE: Multidetector CT imaging of the abdomen and pelvis was performed using the standard protocol following bolus administration of intravenous contrast. CONTRAST:  100mL ISOVUE-300 IOPAMIDOL (ISOVUE-300) INJECTION 61% COMPARISON:  CT scan 11/15/2015 and pelvic ultrasound from today. FINDINGS: Lower chest: The lung bases are clear of acute process. No pleural effusion or pulmonary lesions. The heart is normal in size. No pericardial effusion. The distal esophagus and aorta are unremarkable. Hepatobiliary: No focal hepatic lesions or intrahepatic biliary dilatation. The gallbladder is normal. No common bile duct dilatation. Pancreas: No mass, inflammation or ductal dilatation. Spleen: Normal size.  No focal lesions. Adrenals/Urinary Tract: The  adrenal glands and kidneys are unremarkable. No ureteral or bladder calculi. No bladder mass. Stomach/Bowel: The stomach, duodenum, small bowel and colon are unremarkable. No acute inflammatory changes, mass lesions or obstructive findings. The terminal ileum appears normal. The appendix is normal. Vascular/Lymphatic: The aorta and branch vessels are normal. The major venous structures are patent. There is a retroaortic left renal vein. No mesenteric or retroperitoneal lymphadenopathy. Reproductive: There is a very large cystic lesion, most likely associated with the right ovary, which is extending up out of the pelvis and into the mid abdomen. It measures 22 x 12 x 15.5 cm. No nodularity or septations. The lesion appears in capsulated without worrisome areas of enhancement. 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 The left ovary is normal except for a small cyst. The uterus is normal. Other: No pelvic lymphadenopathy. No inguinal adenopathy. Small periumbilical abdominal wall hernia containing fat. No subcutaneous lesions. Musculoskeletal: No significant bony findings. IMPRESSION: 1. 22 cm cystic lesion in the abdomen/pelvis likely arising from the right ovary. This has increased in size since the prior CT scan from 1 year ago. Mild mass effect on the bowel which is displaced laterally. 2. 2.5 cm simple appearing left ovarian cyst. 3. No abdominal/pelvic lymphadenopathy or omental or peritoneal surface disease. Electronically Signed   By: P.  Gallerani M.D.   On: 11/30/2016 13:59    MAU Course  Procedures  MDM Toradol 30 mg IV - no relief Abdominal Pelvic CT scan EKG - NSR with abnormal T wave / per Dr. Hochein on-call cardiology MD "non-specific T wave abnormalities. No f/u needed"  Assessment and Plan  40 yo G3P3003  Pelvic Mass Abdominal Pain, acute Patient desires surgical management with laparotomy and removal of the pelvic mass.  The risks of surgery were discussed in detail with the  patient including but not limited to: bleeding which may require transfusion or reoperation; infection which may require prolonged hospitalization or re-hospitalization and antibiotic therapy; injury to bowel, bladder, ureters and major vessels or other surrounding organs; need for additional procedures including laparotomy; thromboembolic phenomenon, incisional problems and other postoperative or anesthesia complications.  Patient was told that the likelihood that her condition and symptoms will be treated effectively with this surgical management was very high; the postoperative expectations were also discussed in detail. The patient also understands the alternative treatment options which were discussed in full. All questions were answered.  Prep for surgery today /   Dr. Arnold assumed care of patient  Rolitta Dawson MSN, CNM 11/30/2016, 10:14 AM   Attestation of Attending Supervision of Advanced Practitioner (CNM/NP/PA): Evaluation and management procedures were performed by the Advanced Practitioner under my supervision and collaboration. I have reviewed the Advanced Practitioner's note and chart, and I agree with the management and plan.  James Arnold MD      Electronically signed by Dawson, Rolitta, CNM at 11/30/2016 2:19 PM Electronically signed by Arnold, James G, MD at 11/30/2016 2:31 PM      Admission (Current) on 11/30/2016        Revision History        Detailed Report      

## 2016-12-01 NOTE — H&P (Signed)
History   CSN: [id number]  Arrival date and time: 11/30/16 0945   First Provider Initiated Contact with Patient 11/30/16 1000         Chief Complaint  Patient presents with  . Abdominal Pain   Spanish Interpreter - Marta used for examination  Ms. [address] [place] is a 41 yo [phone] non-pregnant female presenting by EMS for continued severe RLQ pain that started suddenly yesterday while "celebrating Mother's Day with family".  She vomited last night and has not been able to eat today. She took Dilaudid with no relief. She received Fentanyl 2 mcg while on EMS with minimal relief; pain rated 8/10.  She is known to have a large pelvic mass and is scheduled to have it surgically removed on 12/04/2016 by Dr. Debroah Loop.   Abdominal Pain  This is a chronic problem. The current episode started yesterday. The onset quality is sudden. The problem occurs intermittently. The problem has been rapidly worsening. The pain is located in the RLQ. The pain is at a severity of 8/10. The pain is moderate. The quality of the pain is sharp and colicky. The abdominal pain radiates to the LLQ, RUQ and back. Associated symptoms include anorexia, nausea and vomiting. The pain is aggravated by movement and vomiting. The pain is relieved by nothing. She has tried oral narcotic analgesics for the symptoms. The treatment provided mild relief. Prior diagnostic workup includes ultrasound.          Past Medical History:  Diagnosis Date  . Hyperlipemia          Past Surgical History:  Procedure Laterality Date  . NO PAST SURGERIES           Family History  Problem Relation Age of Onset  . Diabetes Father            Social History   Substance Use Topics   . Smoking status: Never Smoker   . Smokeless tobacco: Never Used   . Alcohol use Yes     Comment: weekends     Allergies: No Known Allergies         Prescriptions Prior to Admission  Medication Sig Dispense Refill Last  Dose  . HYDROmorphone (DILAUDID) 2 MG tablet Take 1-2 tablets (2-4 mg total) by mouth every 4 (four) hours as needed for severe pain. 40 tablet 0 11/30/2016 at 0630  . ibuprofen (ADVIL,MOTRIN) 800 MG tablet Take 1 tablet (800 mg total) by mouth 3 (three) times daily. 21 tablet 0 11/29/2016 at Unknown time  . etonogestrel (NEXPLANON) 68 MG IMPL implant 1 each by Subdermal route once.   Continuous    Review of Systems  Constitutional: Positive for appetite change.  HENT: Negative.   Eyes: Negative.   Respiratory: Negative.   Cardiovascular: Negative.   Gastrointestinal: Positive for abdominal distention, abdominal pain, anorexia, nausea and vomiting.  Endocrine: Negative.   Genitourinary: Positive for pelvic pain (RLQ pain like contractions with intermittent lower abdominal pressure).  Musculoskeletal: Positive for back pain.   Physical Exam   Blood pressure 126/78, pulse 77, temperature 98.1 F (36.7 C), temperature source Oral, resp. rate 18, height 5\' 4"  (1.626 m), weight 76.2 kg (168 lb), last menstrual period 11/28/2016, SpO2 99 %.  Physical Exam  Constitutional: She is oriented to person, place, and time. She appears well-developed and well-nourished. She appears distressed.  HENT:  Head: Normocephalic.  Eyes: Pupils are equal, round, and reactive to light.  Neck: Normal range of motion.  Cardiovascular: Normal rate, regular  rhythm and normal heart sounds.   Respiratory: Effort normal and breath sounds normal.  GI: Soft.  Hypoactive BS  Genitourinary:  Genitourinary Comments: deferred  Musculoskeletal: Normal range of motion.  Neurological: She is alert and oriented to person, place, and time. She has normal reflexes.  Skin: Skin is warm and dry.  Psychiatric: She has a normal mood and affect. Her behavior is normal. Judgment and thought content normal.    LabResultsLast24Hours       Results for orders placed or performed during the hospital encounter of  11/30/16 (from the past 24 hour(s))  CBC with Differential     Status: Abnormal   Collection Time: 11/30/16 12:40 AM  Result Value Ref Range   WBC 12.1 (H) 4.0 - 10.5 K/uL   RBC 4.82 3.87 - 5.11 MIL/uL   Hemoglobin 14.0 12.0 - 15.0 g/dL   HCT 16.141.2 09.636.0 - 04.546.0 %   MCV 85.5 78.0 - 100.0 fL   MCH 29.0 26.0 - 34.0 pg   MCHC 34.0 30.0 - 36.0 g/dL   RDW 40.912.7 81.111.5 - 91.415.5 %   Platelets 280 150 - 400 K/uL   Neutrophils Relative % 40 %   Neutro Abs 4.8 1.7 - 7.7 K/uL   Lymphocytes Relative 48 %   Lymphs Abs 5.8 (H) 0.7 - 4.0 K/uL   Monocytes Relative 9 %   Monocytes Absolute 1.1 (H) 0.1 - 1.0 K/uL   Eosinophils Relative 4 %   Eosinophils Absolute 0.5 0.0 - 0.7 K/uL   Basophils Relative 0 %   Basophils Absolute 0.0 0.0 - 0.1 K/uL  Type and screen [facility]     Status: None   Collection Time: 11/30/16 12:45 AM  Result Value Ref Range   ABO/RH(D) B POS    Antibody Screen NEG    Sample Expiration 12/03/2016   Urinalysis, Routine w reflex microscopic     Status: Abnormal   Collection Time: 11/30/16  1:30 AM  Result Value Ref Range   Color, Urine STRAW (A) YELLOW   APPearance CLEAR CLEAR   Specific Gravity, Urine 1.006 1.005 - 1.030   pH 6.0 5.0 - 8.0   Glucose, UA NEGATIVE NEGATIVE mg/dL   Hgb urine dipstick NEGATIVE NEGATIVE   Bilirubin Urine NEGATIVE NEGATIVE   Ketones, ur NEGATIVE NEGATIVE mg/dL   Protein, ur NEGATIVE NEGATIVE mg/dL   Nitrite NEGATIVE NEGATIVE   Leukocytes, UA NEGATIVE NEGATIVE  Pregnancy, urine POC     Status: None   Collection Time: 11/30/16  1:34 AM  Result Value Ref Range   Preg Test, Ur NEGATIVE NEGATIVE      ImagingResults(Last48hours)  [place] Pelvis Complete  Result Date: 11/30/2016 CLINICAL DATA:  41 y/o  F; abdominal pain, evaluate pelvic mass. EXAM: TRANSABDOMINAL AND TRANSVAGINAL ULTRASOUND OF PELVIS TECHNIQUE: Both transabdominal and transvaginal ultrasound examinations of the pelvis  were performed. Transabdominal technique was performed for global imaging of the pelvis including uterus, ovaries, adnexal regions, and pelvic cul-de-sac. It was necessary to proceed with endovaginal exam following the transabdominal exam to visualize the adnexa. COMPARISON:  01/03/2016 pelvic ultrasound FINDINGS: Uterus Measurements: 8.6 x 3.6 x 4.8 cm. No fibroids or other mass visualized. Endometrium Thickness: 7.3 mm.  No focal abnormality visualized. Right ovary 19.5 x 11.5 x 16.4 cystic pelvic mass, probably arising from right adnexal, increased in size in comparison with prior pelvic ultrasound or it measured 11.1 x 6.3 x 15.1 cm. No solid component identified. Ovary not visualized. Left ovary Ovary not visualized.  Other findings No abnormal free fluid. IMPRESSION: Interval increase in size of cystic pelvic mass up to 19.5 cm. This can be further evaluated with pelvic MRI and surgical consultation is recommended. Electronically Signed   By: Mitzi Hansen M.D.   On: 11/30/2016 01:49   Ct Abdomen Pelvis W Contrast  Result Date: 11/30/2016 CLINICAL DATA:  Severe abdominal pain.  Known large ovarian lesion. EXAM: CT ABDOMEN AND PELVIS WITH CONTRAST TECHNIQUE: Multidetector CT imaging of the abdomen and pelvis was performed using the standard protocol following bolus administration of intravenous contrast. CONTRAST:  ISOVUE-300 IOPAMIDOL (ISOVUE-300) INJECTION 61% COMPARISON:  CT scan 11/15/2015 and pelvic ultrasound from today. FINDINGS: Lower chest: The lung bases are clear of acute process. No pleural effusion or pulmonary lesions. The heart is normal in size. No pericardial effusion. The distal esophagus and aorta are unremarkable. Hepatobiliary: No focal hepatic lesions or intrahepatic biliary dilatation. The gallbladder is normal. No common bile duct dilatation. Pancreas: No mass, inflammation or ductal dilatation. Spleen: Normal size.  No focal lesions. Adrenals/Urinary Tract: The  adrenal glands and kidneys are unremarkable. No ureteral or bladder calculi. No bladder mass. Stomach/Bowel: The stomach, duodenum, small bowel and colon are unremarkable. No acute inflammatory changes, mass lesions or obstructive findings. The terminal ileum appears normal. The appendix is normal. Vascular/Lymphatic: The aorta and branch vessels are normal. The major venous structures are patent. There is a retroaortic left renal vein. No mesenteric or retroperitoneal lymphadenopathy. Reproductive: There is a very large cystic lesion, most likely associated with the right ovary, which is extending up out of the pelvis and into the mid abdomen. It measures 22 x 12 x 15.5 cm. No nodularity or septations. The lesion appears in capsulated without worrisome areas of enhancement. 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 The left ovary is normal except for a small cyst. The uterus is normal. Other: No pelvic lymphadenopathy. No inguinal adenopathy. Small periumbilical abdominal wall hernia containing fat. No subcutaneous lesions. Musculoskeletal: No significant bony findings. IMPRESSION: 1. 22 cm cystic lesion in the abdomen/pelvis likely arising from the right ovary. This has increased in size since the prior CT scan from 1 year ago. Mild mass effect on the bowel which is displaced laterally. 2. 2.5 cm simple appearing left ovarian cyst. 3. No abdominal/pelvic lymphadenopathy or omental or peritoneal surface disease. Electronically Signed   By: Rudie Meyer M.D.   On: 11/30/2016 13:59    MAU Course  Procedures  MDM Toradol 30 mg IV - no relief Abdominal Pelvic CT scan EKG - NSR with abnormal T wave / per Dr. Durene Fruits on-call cardiology MD "non-specific T wave abnormalities. No f/u needed"  Assessment and Plan  41 yo [id number]  Pelvic Mass Abdominal Pain, acute Patient desires surgical management with laparotomy and removal of the pelvic mass.  The risks of surgery were discussed in detail with the  patient including but not limited to: bleeding which may require transfusion or reoperation; infection which may require prolonged hospitalization or re-hospitalization and antibiotic therapy; injury to bowel, bladder, ureters and major vessels or other surrounding organs; need for additional procedures including laparotomy; thromboembolic phenomenon, incisional problems and other postoperative or anesthesia complications.  Patient was told that the likelihood that her condition and symptoms will be treated effectively with this surgical management was very high; the postoperative expectations were also discussed in detail. The patient also understands the alternative treatment options which were discussed in full. All questions were answered.  Prep for surgery today /  Dr. Debroah Loop assumed care of patient  Raelyn Mora MSN, CNM 11/30/2016, 10:14 AM   Attestation of Attending Supervision of Advanced Practitioner (CNM/NP/PA): Evaluation and management procedures were performed by the Advanced Practitioner under my supervision and collaboration. I have reviewed the Advanced Practitioner's note and chart, and I agree with the management and plan.  Scheryl Darter MD      Electronically signed by Raelyn Mora, CNM at 11/30/2016 2:19 PM Electronically signed by Adam Phenix, MD at 11/30/2016 2:31 PM      Admission (Current) on 11/30/2016        Revision History        Detailed Report

## 2016-12-01 NOTE — Progress Notes (Signed)
1 Day Post-Op Procedure(s) (LRB): LAPAROTOMY (N/A), RSO  Subjective: Patient reports incisional pain and tolerating PO.    Objective: I have reviewed patient's vital signs, intake and output, medications and labs. Blood pressure 118/72, pulse 91, temperature 98.2 F (36.8 C), temperature source Oral, resp. rate 18, height 5\' 4"  (1.626 m), weight 76.2 kg (168 lb), last menstrual period 11/28/2016, SpO2 97 %.  General: alert, cooperative and no distress GI: soft, non-tender; bowel sounds normal; no masses,  no organomegaly and incision: clean, dry and intact Extremities: extremities normal, atraumatic, no cyanosis or edema  Intake/Output Summary (Last 24 hours) at 12/01/16 0728 Last data filed at 11/30/16 2206  Gross per 24 hour  Intake             1895 ml  Output             1200 ml  Net              695 ml   CBC    Component Value Date/Time   WBC 13.4 (H) 12/01/2016 0538   RBC 4.11 12/01/2016 0538   HGB 12.2 12/01/2016 0538   HCT 35.7 (L) 12/01/2016 0538   PLT 247 12/01/2016 0538   MCV 86.9 12/01/2016 0538   MCH 29.7 12/01/2016 0538   MCHC 34.2 12/01/2016 0538   RDW 13.0 12/01/2016 0538   LYMPHSABS 5.8 (H) 11/30/2016 0040   MONOABS 1.1 (H) 11/30/2016 0040   EOSABS 0.5 11/30/2016 0040   BASOSABS 0.0 11/30/2016 0040     Assessment: s/p Procedure(s): LAPAROTOMY (N/A): stable, progressing well and tolerating diet  Plan: Advance diet Encourage ambulation Discontinue IV fluids  LOS: 1 day    Scheryl DarterJames Arnold 12/01/2016, 7:26 AM

## 2016-12-01 NOTE — Progress Notes (Signed)
Interpretor to the bedside at change of shift. Discussed POC with pt. And family (diet, pain, ambulation, surgical care). Pt. Had no report of pain and was ordering breakfast. Will continue to monitor.

## 2016-12-02 MED ORDER — DOCUSATE SODIUM 100 MG PO CAPS: 100.0000 mg | ORAL_CAPSULE | Freq: Two times a day (BID) | ORAL | 2 refills | Status: DC | PRN

## 2016-12-02 MED ORDER — OXYCODONE-ACETAMINOPHEN 5-325 MG PO TABS: 1.0000 | ORAL_TABLET | ORAL | 0 refills | Status: DC | PRN

## 2016-12-02 NOTE — Discharge Planning (Signed)
Interpretor Silvia to the bedside to explain discharge instructions and help with questions. AVS reviewed with patient. Post op instructions for surgery and medications reviewed. Diet reviewed, pain control reviewed.  Nicole Velasquez - Artistinancial Counselor requested to bedside, and phoned. Will visit pt. Before she d/c today.  All questions reviewed. Spanish and [place] AVS given.

## 2016-12-02 NOTE — Discharge Summary (Signed)
Physician Discharge Summary  Patient ID: Nicole Velasquez MRN: [id number] DOB/AGE: 40-Feb-1977 40 y.o.  Admit date: 11/30/2016 Discharge date: 12/02/2016  Admission Diagnoses: Pelvic mass  Discharge Diagnoses:  Active Problems:   Ovarian mass, right   Cyst of right ovary   Discharged Condition: good  Hospital Course: Pt was admitted with pelvic mass. Underwent Exp lap with RSO. See OP note for additional information. Post op course was unremarkable. Progressed to ambulating and voiding and passing flatus. Tolerating diet and good oral pain control. Felt amenable for d/c home on POD # 2   Discharge Exam: Blood pressure 126/77, pulse 80, temperature 98.1 F (36.7 C), temperature source Oral, resp. rate 16, height 5\' 4"  (1.626 m), weight 76.2 kg (168 lb), last menstrual period 11/28/2016, SpO2 100 %.  PE: Lungs clear Heart RRR Abd soft + BS incision healing well Ext non tender  Disposition: 01-Home or Self Care  Discharge Instructions    Call MD for:  difficulty breathing, headache or visual disturbances    Complete by:  As directed    Call MD for:  extreme fatigue    Complete by:  As directed    Call MD for:  persistant dizziness or light-headedness    Complete by:  As directed    Call MD for:  persistant nausea and vomiting    Complete by:  As directed    Call MD for:  redness, tenderness, or signs of infection (pain, swelling, redness, odor or green/yellow discharge around incision site)    Complete by:  As directed    Call MD for:  severe uncontrolled pain    Complete by:  As directed    Call MD for:  temperature >100.4    Complete by:  As directed    Diet general    Complete by:  As directed    Driving Restrictions    Complete by:  As directed    No driving x 2 weeks or while taking narcotic pain medications   Increase activity slowly    Complete by:  As directed    Sexual Activity Restrictions    Complete by:  As directed    Pelvic rest until post op appt.      Allergies as of 12/02/2016   No Known Allergies     Medication List    STOP taking these medications   HYDROmorphone 2 MG tablet Commonly known as:  DILAUDID     TAKE these medications   docusate sodium 100 MG capsule Commonly known as:  COLACE Take 1 capsule (100 mg total) by mouth 2 (two) times daily as needed.   etonogestrel 68 MG Impl implant Commonly known as:  NEXPLANON 1 each by Subdermal route once.   ibuprofen 800 MG tablet Commonly known as:  ADVIL,MOTRIN Take 1 tablet (800 mg total) by mouth 3 (three) times daily.   oxyCODONE-acetaminophen 5-325 MG tablet Commonly known as:  PERCOCET/ROXICET Take 1-2 tablets by mouth every 4 (four) hours as needed for severe pain (moderate to severe pain (when tolerating fluids)).      Follow-up Information    Adam PhenixArnold, James G, MD. Schedule an appointment as soon as possible for a visit in 4 week(s).   Specialty:  Obstetrics and Gynecology Why:  Post op appt. Contact information: [address] [place] [address] [phone]           Signed: Hermina StaggersMichael L Jaysun Wessels 12/02/2016, 8:02 AM

## 2016-12-02 NOTE — Discharge Instructions (Signed)
Exploratory Laparotomy, Adult Exploratory laparotomy is a surgical procedure to examine the organs inside your belly (abdomen). Another name for this is abdominal exploration. You may have this procedure if you have abdominal pain, trauma, bleeding, infection, or obstruction. The procedure may be done if your health care provider cannot make a diagnosis from only an exam and testing. Exploratory laparotomy may be a planned procedure or an emergency procedure. You may have surgical treatment as part of the laparotomy, or you may have additional treatment after your laparotomy. This will depend on what your surgeon finds during the procedure. Tell a health care provider about:  Any allergies you have.  All medicines you are taking, including vitamins, herbs, eye drops, creams, and over-the-counter medicines.  Any problems you or family members have had with anesthetic medicines.  Any blood disorders you have.  Any surgeries you have had.  Any medical conditions you have. What are the risks? Generally, this is a safe procedure. However, problems can occur and include:  Bleeding.  Infection.  A blood clot that forms in your leg and travels to your lungs.  Damage to organs inside your abdomen.  Scar tissue that blocks your digestive tract. What happens before the procedure?  Ask your health care provider about:  Changing or stopping your regular medicines. This is especially important if you are taking diabetes medicines or blood thinners.  Taking medicines such as aspirin and ibuprofen. These medicines can thin your blood. Do not take these medicines before your procedure if your health care provider instructs you not to.  Do not eat or drink anything after midnight on the night before the procedure or as directed by your health care provider.  You may be given instructions for clearing out your bowel before surgery (bowel prep). If you are already in the hospital, the bowel prep  may be done there. What happens during the procedure?  An IV tube may be inserted into a vein. You may receive fluids and medicine through the IV tube. This may include antibiotic medicine to treat or prevent infection.  You will be given a medicine that makes you go to sleep (general anesthetic).  You may have a tube placed through your nose and into your stomach (nasogastric tube) to drain your stomach fluids.  You may have a tube placed into your bladder (urinary catheter) to drain urine.  Your abdomen will be cleaned with a germ-killing solution (antiseptic).  The surgeon will make a surgical cut (incision) in your abdomen. This is usually an up-and-down incision in the midsection of your abdomen. The incision will go through the inside lining of your abdomen (peritoneum).  Your surgeon will spread the incision wide enough to examine the inside of your abdomen.  The rest of the procedure will depend on what the surgeon finds:  The surgeon will check all organs in your abdomen for damage or obstruction. Repairs will be made when possible.  If there is blood in the abdomen, the surgeon will look for the source of the bleeding in order to stop it.  If there is yellowish-white fluid (pus) or gastric fluids in your abdomen, the surgeon will check for an infection or a hole (perforation) in your digestive tract.  If the surgeon finds infection, a drain may be placed to empty fluid that can build up in your abdomen after surgery.  If there is a growth (tumor) inside your abdomen, the surgeon may remove a piece of the growth (biopsy) to examine it  under a microscope.  When all procedures are complete, the surgeon will close your abdomen with layers of stitches (sutures).  The incision through the skin of your abdomen will be closed with sutures or staples. What happens after the procedure?  Your blood pressure, heart rate, breathing rate, and blood oxygen level will be monitored often  until the medicines you were given have worn off.  You will continue to receive fluids and nutrition through your IV tube. This will stop when you can eat and drink on your own.  You may also get antibiotic medicine and pain medicine through your IV tube.  Your nasogastric tube may be removed when you start to pass gas.  Your urinary catheter may be removed when the anesthetic wears off. This information is not intended to replace advice given to you by your health care provider. Make sure you discuss any questions you have with your health care provider. Document Released: 04/01/2001 Document Revised: 12/13/2015 Document Reviewed: 02/22/2014 Elsevier Interactive Patient Education  2017 [employer]

## 2016-12-04 ENCOUNTER — Ambulatory Visit (HOSPITAL_COMMUNITY): Admission: RE | Admit: 2016-12-04 | Payer: Self-pay | Source: Ambulatory Visit | Admitting: Obstetrics & Gynecology

## 2016-12-04 SURGERY — LAPAROTOMY
Anesthesia: Choice | Site: Abdomen

## 2016-12-20 NOTE — Addendum Note (Signed)
Addendum  created 12/20/16 1000 by Johnny Gorter, MD   Sign clinical note    

## 2017-01-07 ENCOUNTER — Ambulatory Visit (INDEPENDENT_AMBULATORY_CARE_PROVIDER_SITE_OTHER): Payer: Self-pay | Admitting: Obstetrics & Gynecology

## 2017-01-07 ENCOUNTER — Encounter: Payer: Self-pay | Admitting: Obstetrics & Gynecology

## 2017-01-07 VITALS — BP 123/65 | HR 71 | Wt 163.5 lb

## 2017-01-07 DIAGNOSIS — Z9889 Other specified postprocedural states: Secondary | ICD-10-CM

## 2017-01-07 NOTE — Progress Notes (Signed)
Subjective:     Nicole Velasquez is a 41 y.o. female who presents to the clinic 5 weeks status post laparotomy and right SO for large benign ovarian cyst   . Eating a regular diet without difficulty. Bowel movements are normal. Pain is controlled without any medications.  The following portions of the patient's history were reviewed and updated as appropriate: allergies, current medications, past family history, past medical history, past social history, past surgical history and problem list.  Review of Systems Pertinent items are noted in HPI.    Objective:    BP 123/65   Pulse 71   Wt 163 lb 8 oz (74.2 kg)   BMI 28.06 kg/m  General:  alert, cooperative and no distress  Abdomen: soft, non-tender  Incision:   healing well, no drainage, no erythema, no hernia, no seroma, no swelling, no dehiscence, incision well approximated     Assessment:    Doing well postoperatively. mild pain, needs to wait to return to work Operative findings again reviewed. Pathology report discussed.    Plan:    1. Continue any current medications. 2. Wound care discussed. 3. Activity restrictions: none 4. Anticipated return to work: 1-2 weeks. 5. Follow up prn  Adam PhenixArnold, Ahlivia Salahuddin G, MD 01/07/2017

## 2017-01-07 NOTE — Patient Instructions (Signed)
Laparotoma exploratoria en adultos, cuidados posteriores (Exploratory Laparotomy, Adult, Care After) Siga estas instrucciones durante las prximas semanas. Estas indicaciones le proporcionan informacin acerca de cmo deber cuidarse despus del procedimiento. El mdico tambin podr darle instrucciones ms especficas. El tratamiento ha sido planificado segn las prcticas mdicas actuales, pero en algunos casos pueden ocurrir problemas. Comunquese con el mdico si tiene algn problema o tiene dudas despus del procedimiento. QU ESPERAR DESPUS DEL PROCEDIMIENTO Despus del procedimiento, es normal tener los siguientes sntomas:  Dolor abdominal.  Fatiga.  Dolor de garganta debido a las cnulas.  Falta de apetito. INSTRUCCIONES PARA EL CUIDADO EN EL HOGAR Medicamentos  Tome los medicamentos solamente como se lo haya indicado el mdico.  No conduzca ni opere maquinaria pesada mientras toma analgsicos. Cuidado de la incisin  Hay muchas maneras distintas de cerrar y Leonia Reevescubrir una incisin, entre ellas, puntos (suturas), pegamento cutneo y tiras Brunswickadhesivas. Siga las instrucciones del mdico con respecto a lo siguiente: ? Financial plannerCuidar la herida. ? Cambiar y Librarian, academicretirar el vendaje. ? Quitar el cierre de la incisin.  No se duche ni tome baos de inmersin hasta que el mdico lo autorice.  Controle todos los das la zona de la incisin para detectar signos de infeccin. Est atento a lo siguiente: ? Enrojecimiento. ? Dolor a Insurance claims handlerla palpacin. ? Hinchazn. ? Secrecin. Actividades  No levante objetos que pesen ms de 10libras (4,5kg) hasta que el mdico le diga que es seguro [place] Harborhacerlo.  Intente caminar un poco cada da, si el mdico lo autoriza.  Pregntele al mdico cundo puede retomar sus actividades normales, por ejemplo, Science writerconducir, regresar a Printmakertrabajar y Management consultanttener relaciones sexuales. Comida y bebida  Puede seguir su dieta habitual. Helayne SeminoleIncluya en su dieta gran cantidad de cereales integrales,  frutas y verduras. Esto ayudar a Chief Strategy Officerevitar el estreimiento.  Beba suficiente lquido para Photographermantener la orina clara o de color amarillo plido. Instrucciones generales  Concurra a todas las visitas de control como se lo haya indicado el mdico. Esto es importante. SOLICITE ATENCIN MDICA SI:  Lance Mussiene fiebre.  Tiene escalofros.  El medicamento no IT trainerle calma el dolor.  Sufre estreimiento o diarrea.  Tiene nuseas o vmitos.  Hay secrecin, enrojecimiento, hinchazn o dolor en el lugar de la incisin. SOLICITE ATENCIN MDICA DE INMEDIATO SI:  El dolor empeora.  Han pasado ms de 3das desde que pudo defecar.  Tiene vmitos permanentes (persistentes).  Los bordes de la incisin se abren.  Siente calor y dolor a la palpacin en la pantorrilla, y la tiene hinchada.  Tiene dificultad para respirar.  Siente dolor en el pecho. Esta informacin no tiene Theme park managercomo fin reemplazar el consejo del mdico. Asegrese de hacerle al mdico cualquier pregunta que tenga. Document Released: 06/23/2012 Document Revised: 07/28/2014 Document Reviewed: 02/22/2014 Elsevier Interactive Patient Education  2018 [employer].

## 2017-01-12 ENCOUNTER — Encounter: Payer: Self-pay | Admitting: Obstetrics & Gynecology

## 2017-01-19 ENCOUNTER — Other Ambulatory Visit: Payer: Self-pay | Admitting: Obstetrics & Gynecology

## 2017-01-19 DIAGNOSIS — Z1231 Encounter for screening mammogram for malignant neoplasm of breast: Secondary | ICD-10-CM

## 2017-02-20 ENCOUNTER — Ambulatory Visit (INDEPENDENT_AMBULATORY_CARE_PROVIDER_SITE_OTHER): Payer: Self-pay | Admitting: Obstetrics & Gynecology

## 2017-02-20 ENCOUNTER — Encounter: Payer: Self-pay | Admitting: Obstetrics & Gynecology

## 2017-02-20 DIAGNOSIS — R1031 Right lower quadrant pain: Secondary | ICD-10-CM

## 2017-02-20 LAB — POCT URINALYSIS DIP (DEVICE)
BILIRUBIN URINE: NEGATIVE
Glucose, UA: NEGATIVE mg/dL
KETONES UR: NEGATIVE mg/dL
LEUKOCYTES UA: NEGATIVE
Nitrite: NEGATIVE
Protein, ur: NEGATIVE mg/dL
SPECIFIC GRAVITY, URINE: 1.02 (ref 1.005–1.030)
UROBILINOGEN UA: 0.2 mg/dL (ref 0.0–1.0)
pH: 6 (ref 5.0–8.0)

## 2017-02-20 NOTE — Patient Instructions (Signed)
Dolor abdominal en adultos °Abdominal Pain, Adult °El dolor abdominal puede tener muchas causas. A menudo, no es grave y mejora sin tratamiento o con tratamiento en la casa. Sin embargo, a veces el dolor abdominal es intenso. El médico revisará sus antecedentes médicos y le hará un examen físico para tratar de determinar la causa del dolor abdominal. °Siga estas instrucciones en su casa: °· Tome los medicamentos de venta libre y los recetados solamente como se lo haya indicado el médico. No tome un laxante a menos que se lo haya indicado el médico. °· Beba suficiente líquido para mantener la orina clara o de color amarillo pálido. °· Controle su afección para ver si hay cambios. °· Concurra a todas las visitas de control como se lo haya indicado el médico. Esto es importante. °Comuníquese con un médico si: °· El dolor abdominal cambia o empeora. °· No tiene apetito o baja de peso sin proponérselo. °· Está estreñido o tiene diarrea durante más de 2 o 3 días. °· Tiene dolor cuando orina o defeca. °· El dolor abdominal lo despierta de noche. °· El dolor empeora con las comidas, después de comer o con determinados alimentos. °· Tiene vómitos y no puede retener nada. °· Tiene fiebre. °Solicite ayuda de inmediato si: °· El dolor no desaparece tan pronto como el médico le dijo que era esperable. °· No puede detener los vómitos. °· El dolor se siente solo en zonas del abdomen, como el lado derecho o la parte inferior izquierda del abdomen. °· Las heces son sanguinolentas o de color negro, o de aspecto alquitranado. °· Tiene dolor intenso, cólicos, o meteorismo en el abdomen. °· Tiene signos de deshidratación, por ejemplo: °? Orina oscura, muy escasa o falta de orina. °? Labios agrietados. °? Boca seca. °? Ojos hundidos. °? Somnolencia. °? Debilidad. °Esta información no tiene como fin reemplazar el consejo del médico. Asegúrese de hacerle al médico cualquier pregunta que tenga. °Document Released: 07/07/2005 Document  Revised: 06/26/2016 Document Reviewed: 12/19/2015 °Elsevier Interactive Patient Education © 2017 Elsevier Inc. ° °

## 2017-02-20 NOTE — Progress Notes (Signed)
Subjective:     Patient ID: Nicole Velasquez, female   DOB: Apr 07, 1976, 41 y.o.   MRN: [id number] Cc: RLQ pain [id number] No LMP recorded. Patient has had an implant. Since starting work after her surgery in May she notes RLQ pain with lifting and standing, no mass, nausea. Past Medical History:  Diagnosis Date  . Hyperlipemia    Past Surgical History:  Procedure Laterality Date  . LAPAROTOMY N/A 11/30/2016   Procedure: LAPAROTOMY;  Surgeon: Adam PhenixArnold, Jerzee Jerome G, MD;  Location: [facility];  Service: Gynecology;  Laterality: N/A;  . NO PAST SURGERIES     No Known Allergies Current Outpatient Prescriptions on File Prior to Visit  Medication Sig Dispense Refill  . etonogestrel (NEXPLANON) 68 MG IMPL implant 1 each by Subdermal route once.    Marland Kitchen. ibuprofen (ADVIL,MOTRIN) 800 MG tablet Take 1 tablet (800 mg total) by mouth 3 (three) times daily. 21 tablet 0   No current facility-administered medications on file prior to visit.      Review of Systems  Constitutional: Negative.   Gastrointestinal: Positive for abdominal pain. Negative for constipation, diarrhea and nausea.  Genitourinary: Negative for menstrual problem and pelvic pain.       Objective:   Physical Exam  Constitutional: She is oriented to person, place, and time. She appears well-developed. No distress.  Cardiovascular: Normal rate.   Pulmonary/Chest: Effort normal.  Abdominal: Soft. She exhibits no mass. There is tenderness (mild RLQ ). There is no rebound and no guarding.  Genitourinary: Vagina normal and uterus normal. No vaginal discharge found.  Neurological: She is alert and oriented to person, place, and time.  Skin: Skin is warm and dry.  Psychiatric: She has a normal mood and affect. Her behavior is normal.       Assessment:     Pain in lower abdomen 12 weeks postoperative from laparotomy and RSO    Plan:     CT scan to evaluate for possible hernia. RTC for result.  Adam PhenixArnold, Kimorah Ridolfi G, MD 02/20/2017

## 2017-02-20 NOTE — Progress Notes (Signed)
Spanish Interpreter Nile RiggsMariel Gallego CT Scan scheduled for August 10th @ 1315.  Pt notified.

## 2017-02-26 ENCOUNTER — Other Ambulatory Visit: Payer: Self-pay | Admitting: Obstetrics & Gynecology

## 2017-02-26 DIAGNOSIS — N83201 Unspecified ovarian cyst, right side: Secondary | ICD-10-CM

## 2017-02-26 DIAGNOSIS — R1031 Right lower quadrant pain: Secondary | ICD-10-CM

## 2017-02-26 DIAGNOSIS — N83209 Unspecified ovarian cyst, unspecified side: Secondary | ICD-10-CM | POA: Insufficient documentation

## 2017-02-27 ENCOUNTER — Ambulatory Visit (HOSPITAL_COMMUNITY)
Admission: RE | Admit: 2017-02-27 | Discharge: 2017-02-27 | Disposition: A | Discharge status: Home / Self Care | Payer: Self-pay | Source: Ambulatory Visit | Attending: Obstetrics & Gynecology | Admitting: Obstetrics & Gynecology

## 2017-02-27 ENCOUNTER — Ambulatory Visit (HOSPITAL_COMMUNITY): Payer: Self-pay

## 2017-02-27 ENCOUNTER — Other Ambulatory Visit: Payer: Self-pay | Admitting: *Deleted

## 2017-02-27 DIAGNOSIS — K760 Fatty (change of) liver, not elsewhere classified: Secondary | ICD-10-CM | POA: Insufficient documentation

## 2017-02-27 DIAGNOSIS — R1031 Right lower quadrant pain: Secondary | ICD-10-CM | POA: Insufficient documentation

## 2017-02-27 MED ORDER — IOPAMIDOL (ISOVUE-300) INJECTION 61%
100.0000 mL | Freq: Once | INTRAVENOUS | Status: AC | PRN
  Administered 2017-02-27: 100 mL via INTRAVENOUS

## 2017-02-27 NOTE — Progress Notes (Signed)
error 

## 2017-03-02 ENCOUNTER — Ambulatory Visit
Admission: RE | Admit: 2017-03-02 | Discharge: 2017-03-02 | Disposition: A | Discharge status: Home / Self Care | Payer: No Typology Code available for payment source | Source: Ambulatory Visit | Attending: Obstetrics & Gynecology | Admitting: Obstetrics & Gynecology

## 2017-03-02 DIAGNOSIS — Z1231 Encounter for screening mammogram for malignant neoplasm of breast: Secondary | ICD-10-CM

## 2017-03-25 ENCOUNTER — Ambulatory Visit (INDEPENDENT_AMBULATORY_CARE_PROVIDER_SITE_OTHER): Payer: Self-pay | Admitting: Obstetrics & Gynecology

## 2017-03-25 ENCOUNTER — Encounter: Payer: Self-pay | Admitting: Obstetrics & Gynecology

## 2017-03-25 DIAGNOSIS — K760 Fatty (change of) liver, not elsewhere classified: Secondary | ICD-10-CM

## 2017-03-25 NOTE — Patient Instructions (Signed)
Fatty Liver Fatty liver, also called hepatic steatosis or steatohepatitis, is a condition in which too much fat has built up in your liver cells. The liver removes harmful substances from your bloodstream. It produces fluids your body needs. It also helps your body use and store energy from the food you eat. In many cases, fatty liver does not cause symptoms or problems. It is often diagnosed when tests are being done for other reasons. However, over time, fatty liver can cause inflammation that may lead to more serious liver problems, such as scarring of the liver (cirrhosis). What are the causes? Causes of fatty liver may include:  Drinking too much alcohol.  Poor nutrition.  Obesity.  Cushing syndrome.  Diabetes.  Hyperlipidemia.  Pregnancy.  Certain drugs.  Poisons.  Some viral infections.  What increases the risk? You may be more likely to develop fatty liver if you:  Abuse alcohol.  Are pregnant.  Are overweight.  Have diabetes.  Have hepatitis.  Have a high triglyceride level.  What are the signs or symptoms? Fatty liver often does not cause any symptoms. In cases where symptoms develop, they can include:  Fatigue.  Weakness.  Weight loss.  Confusion.  Abdominal pain.  Yellowing of your skin and the white parts of your eyes (jaundice).  Nausea and vomiting.  How is this diagnosed? Fatty liver may be diagnosed by:  Physical exam and medical history.  Blood tests.  Imaging tests, such as an ultrasound, CT scan, or MRI.  Liver biopsy. A small sample of liver tissue is removed using a needle. The sample is then looked at under a microscope.  How is this treated? Fatty liver is often caused by other health conditions. Treatment for fatty liver may involve medicines and lifestyle changes to manage conditions such as:  Alcoholism.  High cholesterol.  Diabetes.  Being overweight or obese.  Follow these instructions at home:  Eat a  healthy diet as directed by your health care provider.  Exercise regularly. This can help you lose weight and control your cholesterol and diabetes. Talk to your health care provider about an exercise plan and which activities are best for you.  Do not drink alcohol.  Take medicines only as directed by your health care provider. Contact a health care provider if: You have difficulty controlling your:  Blood sugar.  Cholesterol.  Alcohol consumption.  Get help right away if:  You have abdominal pain.  You have jaundice.  You have nausea and vomiting. This information is not intended to replace advice given to you by your health care provider. Make sure you discuss any questions you have with your health care provider. Document Released: 08/22/2005 Document Revised: 12/13/2015 Document Reviewed: 11/16/2013 Elsevier Interactive Patient Education  2018 Elsevier Inc.  

## 2017-03-25 NOTE — Progress Notes (Signed)
Subjective:     Patient ID: Nicole Velasquez, female   DOB: 1975/08/28, 41 y.o.   MRN: [id number]  HPI [id number] Patient's last menstrual period was 02/06/2017. CT scan result reviewed   Review of Systems  Constitutional: Negative.   Gastrointestinal: Negative.   Musculoskeletal: Positive for arthralgias (both hands after starting a new job).       Objective:   Physical Exam  Constitutional: She is oriented to person, place, and time. She appears well-developed. No distress.  Pulmonary/Chest: Effort normal.  Abdominal: Soft. She exhibits no distension and no mass. There is no tenderness.  Musculoskeletal: Normal range of motion.  No joint swelling in hands  Neurological: She is alert and oriented to person, place, and time.  Psychiatric: She has a normal mood and affect. Her behavior is normal.   CLINICAL DATA:  Right lower quadrant abdominal pain since in May of this year following surgical removal of a large right ovarian cysts.  EXAM: CT ABDOMEN AND PELVIS WITH CONTRAST  TECHNIQUE: Multidetector CT imaging of the abdomen and pelvis was performed using the standard protocol following bolus administration of intravenous contrast.  CONTRAST:  100mL ISOVUE-300 IOPAMIDOL (ISOVUE-300) INJECTION 61%  COMPARISON:  11/30/2016.  FINDINGS: Lower chest: Unremarkable.  Hepatobiliary: Diffuse low density of the liver relative to the spleen. Normal appearing gallbladder.  Pancreas: Unremarkable. No pancreatic ductal dilatation or surrounding inflammatory changes.  Spleen: Normal in size without focal abnormality.  Adrenals/Urinary Tract: Adrenal glands are unremarkable. Kidneys are normal, without renal calculi, focal lesion, or hydronephrosis. Bladder is unremarkable.  Stomach/Bowel: Stomach is within normal limits. Appendix appears normal. No evidence of bowel wall thickening, distention, or inflammatory changes.  Vascular/Lymphatic: No significant  vascular findings are present. No enlarged abdominal or pelvic lymph nodes.  Reproductive: Uterus and bilateral adnexa are unremarkable.  Other: No abdominal wall hernia or abnormality. No abdominopelvic ascites.  Musculoskeletal: Mild lumbar lower thoracic spine degenerative changes.  IMPRESSION: 1. No acute abnormality. 2. Diffuse hepatic steatosis.   Electronically Signed   By: Beckie SaltsSteven  Reid M.D.   On: 02/27/2017 16:39     Assessment:     Patient Active Problem List   Diagnosis Date Noted  . Steatosis of liver 03/25/2017   No postoperative hernia    Plan:     She will discuss the CT result with her PCP and consider LFT  Adam PhenixArnold, Vinie Charity G, MD 03/25/2017

## 2017-03-25 NOTE — Progress Notes (Signed)
Patient comes to review CT result

## 2017-03-25 NOTE — Progress Notes (Signed)
Patient here today for Routine GYN follow up visit.  Patient complains of her hands being numb and swollen in the morning.for 2 months, with pain at a level 8.

## 2017-07-29 ENCOUNTER — Encounter (HOSPITAL_COMMUNITY): Payer: Self-pay

## 2017-12-23 ENCOUNTER — Other Ambulatory Visit: Payer: Self-pay | Admitting: Obstetrics & Gynecology

## 2017-12-23 DIAGNOSIS — Z1231 Encounter for screening mammogram for malignant neoplasm of breast: Secondary | ICD-10-CM

## 2018-03-04 ENCOUNTER — Encounter (HOSPITAL_COMMUNITY): Payer: Self-pay

## 2018-03-04 ENCOUNTER — Ambulatory Visit (HOSPITAL_COMMUNITY)
Admission: RE | Admit: 2018-03-04 | Discharge: 2018-03-04 | Disposition: A | Discharge status: Home / Self Care | Payer: Self-pay | Source: Ambulatory Visit | Attending: Obstetrics and Gynecology | Admitting: Obstetrics and Gynecology

## 2018-03-04 ENCOUNTER — Ambulatory Visit
Admission: RE | Admit: 2018-03-04 | Discharge: 2018-03-04 | Disposition: A | Discharge status: Home / Self Care | Payer: Self-pay | Source: Ambulatory Visit | Attending: Obstetrics & Gynecology | Admitting: Obstetrics & Gynecology

## 2018-03-04 VITALS — BP 120/72 | Ht 64.0 in

## 2018-03-04 DIAGNOSIS — Z1231 Encounter for screening mammogram for malignant neoplasm of breast: Secondary | ICD-10-CM

## 2018-03-04 DIAGNOSIS — Z01419 Encounter for gynecological examination (general) (routine) without abnormal findings: Secondary | ICD-10-CM

## 2018-03-04 NOTE — Progress Notes (Signed)
No complaints today.   Pap Smear: Pap smear not completed today. [facility] and normal per patient. Per patient has a history of an abnormal Pap smear around 20 years ago that required a colposcopy for follow-up. Per patient all of her Pap smears have been normal since colposcopy and has at least 3 normal Pap smears completed. Last Pap smear result is not in Epic. Pap smear result from 04/20/2013 is in Epic.  Physical exam: Breasts Breasts symmetrical. No skin abnormalities bilateral breasts. No nipple retraction bilateral breasts. No nipple discharge bilateral breasts. No lymphadenopathy. No lumps palpated bilateral breasts. No complaints of pain or tenderness on exam. [facility]. Appointment scheduled for Thursday, March 04, 2018 at 1600.        Pelvic/Bimanual No Pap smear completed today since last Pap smear was in April 2018 per patient. Pap smear not indicated per BCCCP guidelines.   Smoking History: Patient has never smoked.  Patient Navigation: Patient education provided. Access to services provided [facility]. Spanish interpreter provided.   Breast and Cervical Cancer Risk Assessment: Patient has no family history of breast cancer, known genetic mutations, or radiation treatment to the chest before age 41. Per patient has a history of cervical dysplasia. Patient has no history of being immunocompromised or DES exposure in-utero.  Risk Assessment    Risk Scores      03/04/2018   Last edited by: Priscille HeidelbergBrannock, Jevaughn Degollado P, RN   5-year risk: 0.3 %   Lifetime risk: 4.7 %         Used Spanish interpreter Natale LayErika McReynolds from [facility].

## 2018-03-04 NOTE — Patient Instructions (Signed)
Explained breast self awareness with Nicole Velasquez. Patient did not need a Pap smear today due to last Pap smear was in April 2018 per patient. Let her know BCCCP will cover Pap smears every 3 years unless has a history of abnormal Pap smears. [facility]. Appointment scheduled for Thursday, March 04, 2018 at 1600. Let patient know the Breast Center will follow up with her within the next couple weeks with results of mammogram by letter or phone. Nicole Velasquez verbalized understanding.  Borden Thune, Kathaleen Maserhristine Poll, RN 3:36 PM

## 2018-03-08 ENCOUNTER — Other Ambulatory Visit (HOSPITAL_COMMUNITY): Payer: Self-pay | Admitting: *Deleted

## 2018-03-08 DIAGNOSIS — Z0000 Encounter for general adult medical examination without abnormal findings: Secondary | ICD-10-CM

## 2018-03-10 ENCOUNTER — Inpatient Hospital Stay: Payer: PRIVATE HEALTH INSURANCE

## 2018-03-10 ENCOUNTER — Inpatient Hospital Stay: Payer: PRIVATE HEALTH INSURANCE | Attending: Obstetrics and Gynecology | Admitting: *Deleted

## 2018-03-10 VITALS — BP 100/62 | Ht 62.0 in | Wt 163.5 lb

## 2018-03-10 DIAGNOSIS — Z0000 Encounter for general adult medical examination without abnormal findings: Secondary | ICD-10-CM

## 2018-03-10 LAB — LIPID PANEL
CHOLESTEROL: 288 mg/dL — AB (ref 0–200)
HDL: 32 mg/dL — ABNORMAL LOW (ref >40)
LDL Cholesterol: UNDETERMINED mg/dL (ref 0–99)
Total CHOL/HDL Ratio: 9 RATIO
Triglycerides: 564 mg/dL — ABNORMAL HIGH (ref <150)
VLDL: UNDETERMINED mg/dL (ref 0–40)

## 2018-03-10 LAB — HEMOGLOBIN A1C
HEMOGLOBIN A1C: 5.1 % (ref 4.8–5.6)
MEAN PLASMA GLUCOSE: 99.67 mg/dL

## 2018-03-10 NOTE — Progress Notes (Signed)
Wisewoman initial screening  Clinical Measurement:  Height:  62in Weight:  163.5lb Blood Pressure:  90/60  Blood Pressure #2: 100/62  Fasting Labs Drawn Today, will review with patient when they result.  Medical History:  Patient states that she has not been diagnosed with high cholesterol, high blood pressure, diabetes or heart disease.  Medications:  Patients states she is not taking any medications for high cholesterol, high blood pressure or diabetes.  She is not taking aspirin daily to prevent heart attack or stroke.    Blood pressure, self measurement:  Patients states she does not measure blood pressure at home.    Nutrition:  Patient states she eats 0 cups of fruit and 1 cup of vegetables in an average day.  Patient states she does  eat fish regularly, she eats more than half a serving of whole grains daily. She drinks less than 36 ounces of beverages with added sugar weekly.  She is currently watching her sodium intake.  She has not had any drinks containing alcohol in the last seven days.    Physical activity:  Patient states that she gets 210 minutes of moderate exercise in a week.  She gets 60 minutes of vigorous exercise per week.    Smoking status:  Patient states she has never smoked and is not around any smokers.    Quality of life:  Patient states that she has had 0 bad physical days out of the last 30 days. In the last 2 weeks, she has had 0 days that she has felt down or depressed. She has had 0 days in the last 2 weeks that she has had little interest or pleasure in doing things.  Risk reduction and counseling:  Patient states she wants to  increase fruit and vegetable intake.  I encouraged her to continue with current exercise regimen and increase vegetable and fruit intake.  Navigation:  I will notify patient of lab results.  Patient is aware of 2 more health coaching sessions and a follow up.

## 2018-03-10 NOTE — Addendum Note (Signed)
Addended by: Zyana Amaro on: 03/10/2018 08:36 AM   Modules accepted: Orders  

## 2018-03-10 NOTE — Addendum Note (Signed)
Addended by: Donia GuilesPLEASANT, Brylan Dec on: 03/10/2018 08:36 AM   Modules accepted: Orders

## 2018-03-12 ENCOUNTER — Telehealth (HOSPITAL_COMMUNITY): Payer: Self-pay | Admitting: *Deleted

## 2018-03-12 NOTE — Telephone Encounter (Signed)
Health coaching 2  Spanish interpreter- [employer] [place]  # [phone]  Labs- cholesterol 288, triglycerides 564, HDL 32,  LDL unable to calculate if try glycerides over 400mg /dL, hemoglobin 5.1, mean plasma glucose 99.67  Patient is aware and understands these lab results.  Goals- I encouraged patient to eat lean meats, skinless poultry and fish(tuna,salmon and mackerel). I encouraged the patient to choose low fat or no fat dairy products(milk, cheese) and to eat 5 fruits and vegetables daily.  I also encouraged her to walk 30 to 40 minutes daily.  Patient states that she uses low fat dairy and has started walking.  Navigation:  Patient is aware of 1 more health coaching sessions and a follow up. [facility] for [facility] appointment on August 30 at 9:15am regarding her cholesterol.  Time- 10 minutes

## 2018-03-19 ENCOUNTER — Ambulatory Visit (INDEPENDENT_AMBULATORY_CARE_PROVIDER_SITE_OTHER): Payer: Self-pay | Admitting: Internal Medicine

## 2018-03-19 ENCOUNTER — Other Ambulatory Visit: Payer: Self-pay

## 2018-03-19 DIAGNOSIS — E785 Hyperlipidemia, unspecified: Secondary | ICD-10-CM | POA: Insufficient documentation

## 2018-03-19 DIAGNOSIS — E7800 Pure hypercholesterolemia, unspecified: Secondary | ICD-10-CM | POA: Insufficient documentation

## 2018-03-19 NOTE — Patient Instructions (Addendum)
Nicole Velasquez,   Los laboratorios que le hicieron muestran que tiene el colesterol y los trigliceridos altos. Para esto debe alterar su dieta. Debajo tiene Erie Insurance Groupuna dieta que puede.   Regrese en 6 meses para repetirle los examenes de [place]angre.   - Dra. Santos   Colesterol Cholesterol El colesterol es grasa. El organismo necesita una pequea cantidad de colesterol. El colesterol (placa) puede acumularse en los vasos sanguneos (arterias). Esto lo hace ms propenso a sufrir infartos de miocardio o accidentes cerebrovasculares. No puede sentir el nivel de colesterol. La nica forma de saber si el nivel es alto es mediante un anlisis de Wilson[place] Millssangre. Guarde los CDW Corporationresultados del anlisis. Trabaje con el mdico para Engineer, maintenance (IT)mantener el colesterol en un nivel adecuado. Qu significan los resultados?  El colesterol total es la cantidad de colesterol que hay en la Gard[place]angre.  El LDL es el colesterol Morningsidemalo. Este tipo es el que puede acumularse. Intente mantener el nivel de LDL bajo.  El HDL es el colesterol bueno. Limpia los vasos sanguneos y elimina el colesterol LDL. Intente mantener el nivel de HDL alto.  Los triglicridos son grasas que el organismo puede Academic librarianalmacenar o quemar como fuente de Engineer, drillingenerga. Cules son los niveles buenos de colesterol?  El colesterol total debe estar por debajo de 200.  Lo ms aconsejable para quienes tienen riesgos para la salud es mantener el nivel de LDL por debajo de 100. Lo ms aconsejable para quienes tienen muchos riesgos para la salud es mantener el nivel de LDL por debajo de 70.  Se aconseja mantener un nivel de HDL es por encima de 40. Pero lo mejor es tener un nivel de HDL de 60 o superior.  Los triglicridos por debajo de 150. Cmo puedo bajar el colesterol? Dieta Siga el programa de alimentacin que el mdico le indique.  Elija pescados o carne blanca de pollo y pavo asados u horneados. Intente no comer carne roja, alimentos fritos, salchichas ni embutidos.  Coma gran  cantidad de frutas y verduras frescas.  Elija los cereales integrales, los frijoles, las pastas, las papas y los cereales.  Elija aceite de oliva, aceite de maz o aceite de canola. Solo use poca cantidad.  Intente no comer Honomumantequilla, Coquam[place]onesa, Indiamargarina o aceites de Wyndhampalmiste.  Intente no comer alimentos que contengan grasas trans.  Elija alimentos lcteos descremados o sin grasa. ? Beba leche descremada o sin grasa. ? Coma yogur y quesos descremados o sin grasa. ? Intente no consumir leche entera o crema. ? Intente no comer helados, yemas de huevo ni quesos enteros.  Los postres sanos incluyen la torta ngel, los bocadillos de Desert Centerjengibre, las Gaffergalletas con forma de Pendergrassanimales, los caramelos duros, los helados de agua y el yogur descremado o sin grasa. Intente no comer masas, tortas, pasteles y galletas.  La prctica de actividad fsica. Siga el programa de actividad fsica que le indique el mdico.  Sea ms Kual[place]activo. Intente hacer los trabajos de Scottdalejardinera, salir a Advertising account plannercaminar y usar las escaleras.  Consulte al mdico cmo puede aumentar su actividad.  Medicamentos  Baxter Internationalome los medicamentos de venta libre y los recetados solamente como se lo haya indicado el mdico.  Esta informacin no tiene Theme park managercomo fin reemplazar el consejo del mdico. Asegrese de hacerle al mdico cualquier pregunta que tenga. Document Released: 08/09/2010 Document Revised: 10/06/2016 Document Reviewed: 01/17/2016 Elsevier Interactive Patient Education  [employer]2018 Elsevier Inc.

## 2018-03-20 ENCOUNTER — Encounter: Payer: Self-pay | Admitting: Internal Medicine

## 2018-03-20 NOTE — Assessment & Plan Note (Signed)
HLD: Ms. Nicole Velasquez presents for HLD follow up after screening labs showed elevated cholesterol 288, TG 564 and HLD 32. LDL unable to be calculated in the setting of elevated TG. She reports she has a rich diet and eats "a little bit of everything." She does not have a history of diabetes, heart disease, hypertension, or tobacco use. Her ASCVD is 3.1%. Will focus on lifestyle changes as statin therapy not indicated at this time.  - Discussed long-term complications of hyperlipidemia  - Educated patient on diet, exercise, and weight loss. Provided information of appropriate foods to eat  - Follow up in 6 months. Will need repeat lipid panel then.

## 2018-03-20 NOTE — Progress Notes (Signed)
   CC: HLD follow-up  HPI:  [name] is a 42 y.o. female with PMh listed below who presents to clinic for follow up of HLD.   HLD: Ms. Nicole Velasquez presents for HLD follow up after screening labs showed elevated cholesterol 288, TG 564 and HLD 32. LDL unable to be calculated in the setting of elevated TG. She reports she has a rich diet and eats "a little bit of everything." She does not have a history of diabetes, heart disease, hypertension, or tobacco use. Her ASCVD is 3.1%. Will focus on lifestyle changes as statin therapy not indicated at this time.  - Discussed long-term complications of hyperlipidemia  - Educated patient on diet, exercise, and weight loss. Provided information of appropriate foods to eat  - Follow up in 6 months. Will need repeat lipid panel then.   Past Medical History:  Diagnosis Date  . Hyperlipemia    Review of Systems:   Review of Systems  Constitutional: Negative for chills, fever, malaise/fatigue and weight loss.  Respiratory: Negative for shortness of breath.   Cardiovascular: Negative for chest pain, palpitations and leg swelling.  Gastrointestinal: Negative for abdominal pain, blood in stool, constipation, diarrhea, nausea and vomiting.  Neurological: Negative for dizziness and headaches.   Physical Exam: Vitals:   03/19/18 0920  BP: 121/79  Pulse: 76  Temp: 98.6 F (37 C)  TempSrc: Oral  SpO2: 99%  Weight: 166 lb 9.6 oz (75.6 kg)   General: well-appearing young female in NAD  CV:RRR, nl S1/S2, no mrg  Pulm: CTAB, no wheezes or crackles, no increased WOB on RA  Ext: warm and well perfused without cyanosis or edema   Assessment & Plan:   See Encounters Tab for problem based charting.  Patient discussed with Dr. Rogelia BogaButcher

## 2018-03-25 NOTE — Progress Notes (Signed)
Internal Medicine Clinic Attending  Case discussed with Dr. Santos-Sanchez at the time of the visit.  We reviewed the resident's history and exam and pertinent patient test results.  I agree with the assessment, diagnosis, and plan of care documented in the resident's note.    

## 2018-04-16 ENCOUNTER — Telehealth (HOSPITAL_COMMUNITY): Payer: Self-pay | Admitting: *Deleted

## 2018-04-16 NOTE — Telephone Encounter (Signed)
Health coaching 3   Spanish interpreter-  [name], [employer]  Goals-  Patient states that she has started eating oatmeal daily.  Patient states she has started walking about 3 times weekly for 30 minutes.  Patient states she is drinking more water about 4 bottles daily.  I encouraged her to drink at least 8 to 10 glasses of water and to eat at least 5 fruits and vegetables. I also encouraged her to eat lean meats,low fat and fat free dairy. Patient states she is eating less fried food and eating more fish.  Navigation: Patient is aware of a follow up.  Time-  10 minutes

## 2018-04-19 ENCOUNTER — Encounter: Payer: Self-pay | Admitting: General Practice

## 2018-04-19 ENCOUNTER — Ambulatory Visit: Payer: PRIVATE HEALTH INSURANCE

## 2018-05-02 IMAGING — US US PELVIS COMPLETE
1 series · 15 of 25 positions shown · non-contrast
Comparison: 01/03/2016 pelvic ultrasound

CLINICAL DATA: 40 y/o  F; abdominal pain, evaluate pelvic mass.



[Series 1: us pelvis complete · 15 of 25 slices shown]
[im 1/25]
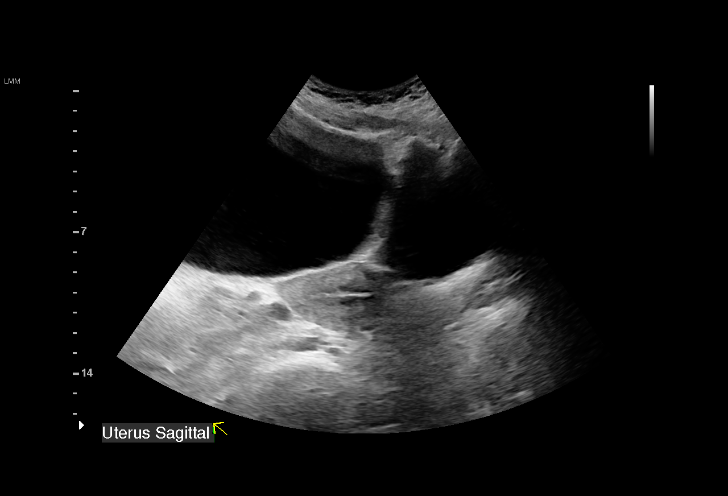
[im 3/25]
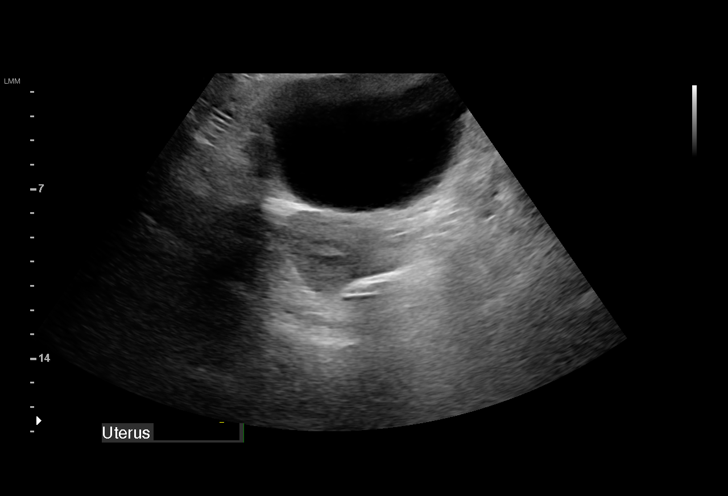
[im 5/25]
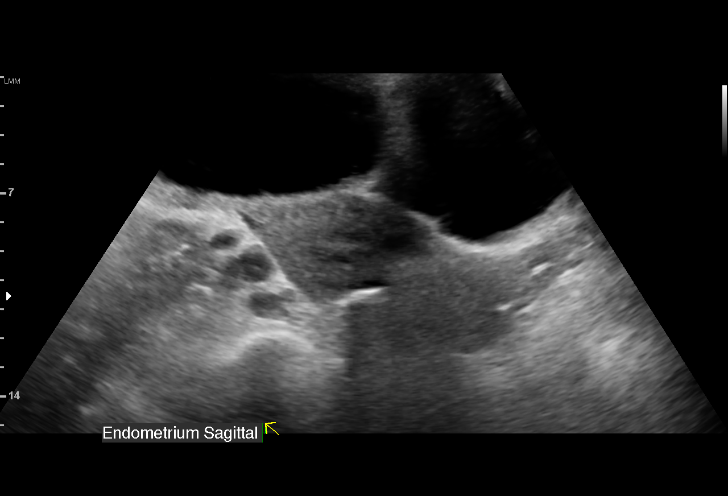
[im 6/25]
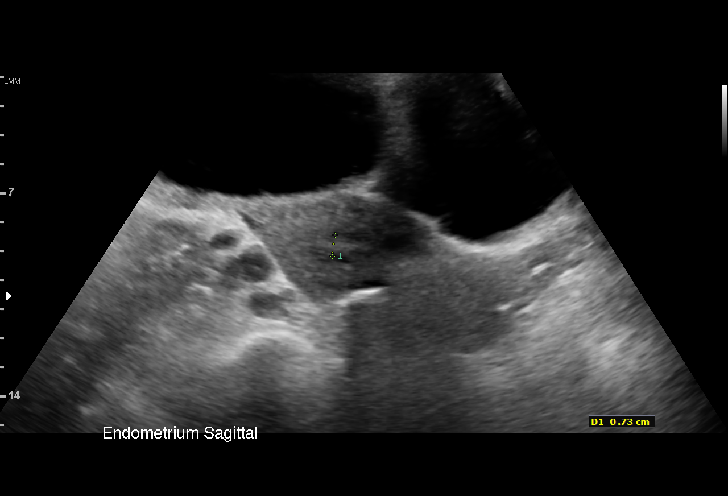
[im 8/25]
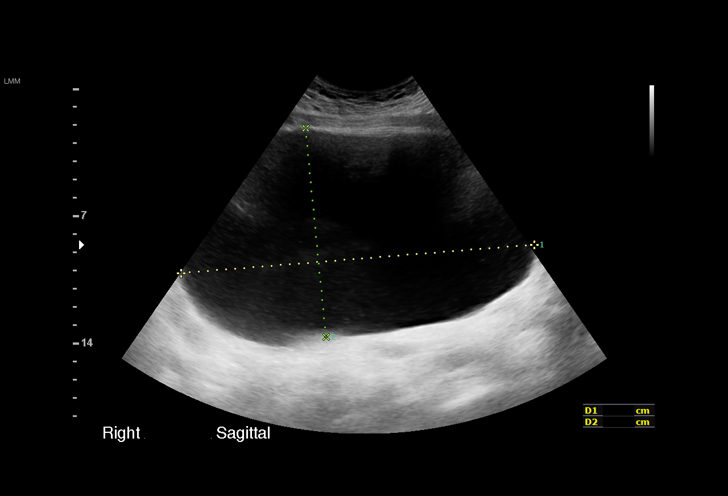
[im 10/25]
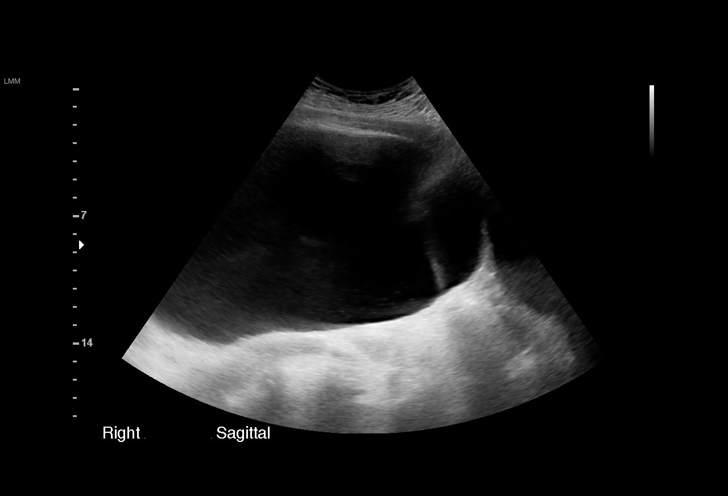
[im 11/25]
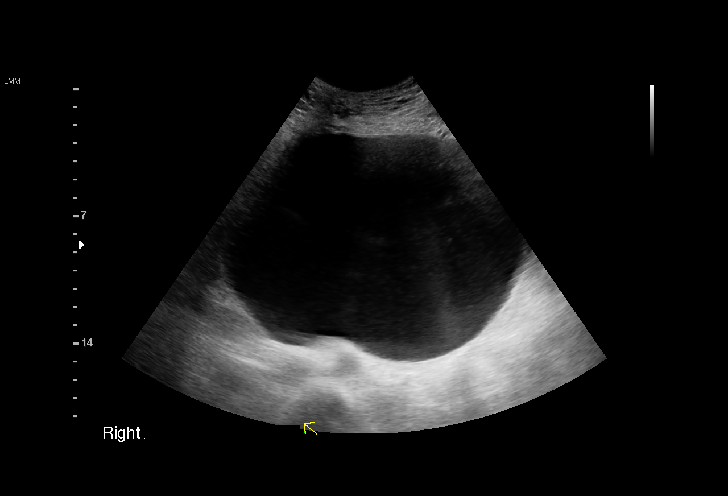
[im 13/25]
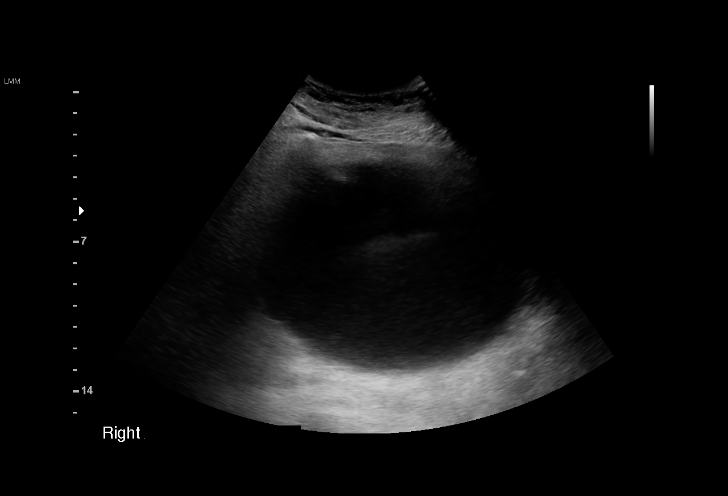
[im 15/25]
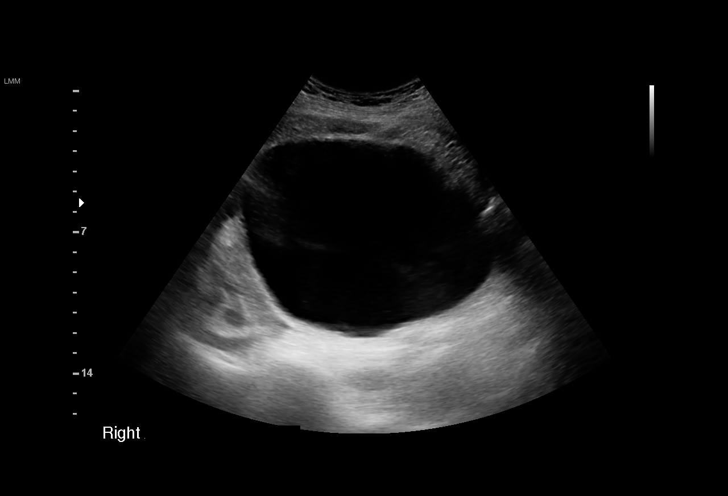
[im 16/25]
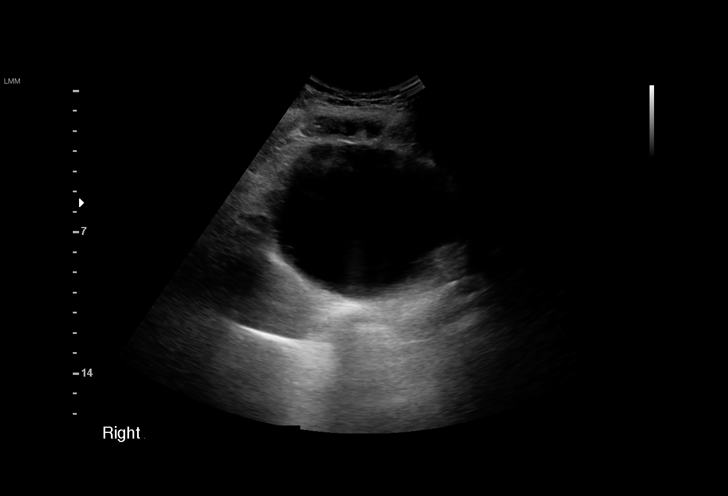
[im 18/25]
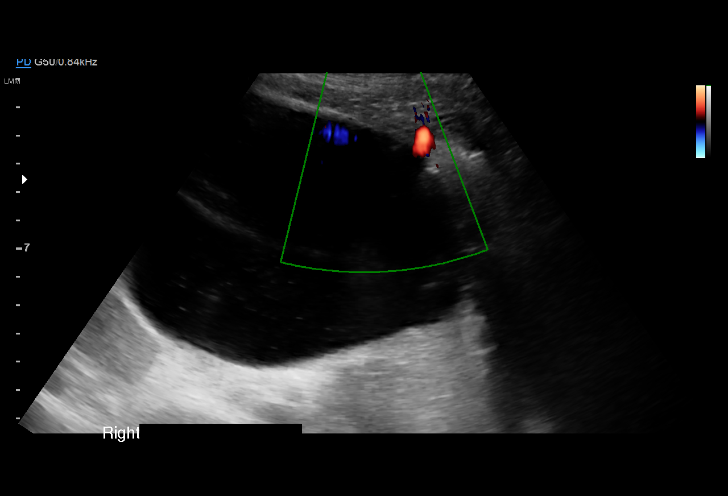
[im 20/25]
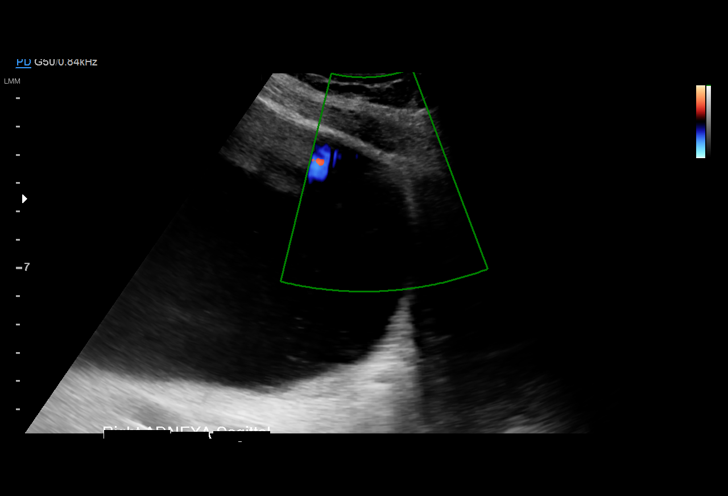
[im 21/25]
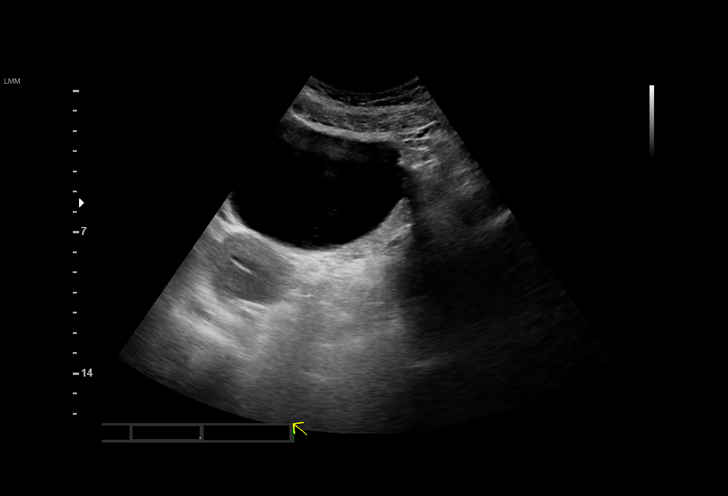
[im 23/25]
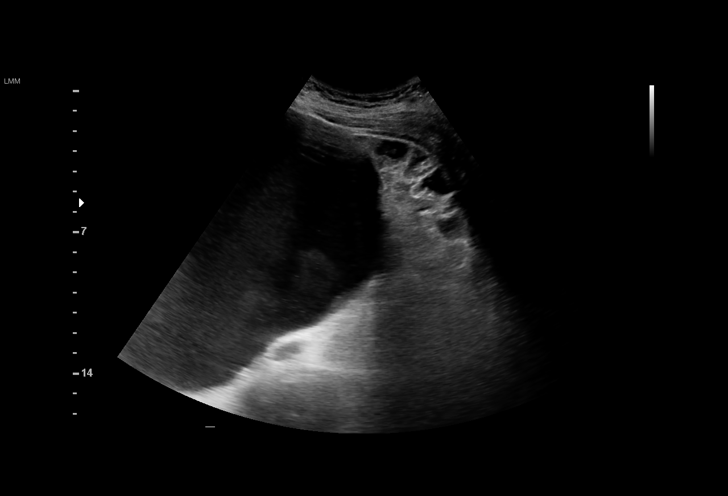
[im 25/25]
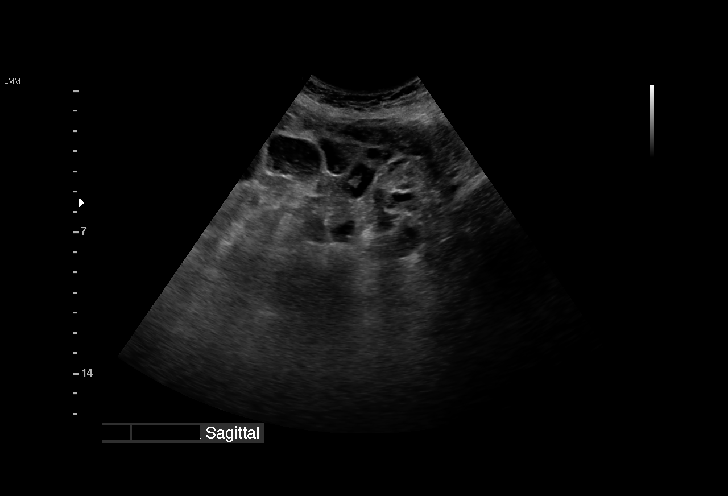

[15 of 25 positions shown; findings below may reference images not displayed]

FINDINGS: Uterus

Measurements: 8.6 x 3.6 x 4.8 cm. No fibroids or other mass
visualized.

Endometrium

Thickness: 7.3 mm.  No focal abnormality visualized.

Right ovary

19.5 x 11.5 x 16.4 cystic pelvic mass, probably arising from right
adnexal, increased in size in comparison with prior pelvic
ultrasound or it measured 11.1 x 6.3 x 15.1 cm. No solid component
identified. Ovary not visualized.

Left ovary

Ovary not visualized.

Other findings

No abnormal free fluid.
IMPRESSION: Interval increase in size of cystic pelvic mass up to 19.5 cm. This
can be further evaluated with pelvic MRI and surgical consultation
is recommended.

By: Fredd Buckingham M.D.

## 2019-02-15 ENCOUNTER — Other Ambulatory Visit (HOSPITAL_COMMUNITY): Payer: Self-pay | Admitting: *Deleted

## 2019-02-15 DIAGNOSIS — Z1231 Encounter for screening mammogram for malignant neoplasm of breast: Secondary | ICD-10-CM

## 2019-05-17 ENCOUNTER — Encounter (HOSPITAL_COMMUNITY): Payer: Self-pay

## 2019-05-24 ENCOUNTER — Other Ambulatory Visit: Payer: Self-pay

## 2019-05-24 ENCOUNTER — Encounter (HOSPITAL_COMMUNITY): Payer: Self-pay

## 2019-05-24 ENCOUNTER — Ambulatory Visit (HOSPITAL_COMMUNITY)
Admission: RE | Admit: 2019-05-24 | Discharge: 2019-05-24 | Disposition: A | Discharge status: Home / Self Care | Payer: PRIVATE HEALTH INSURANCE | Source: Ambulatory Visit | Attending: Obstetrics and Gynecology | Admitting: Obstetrics and Gynecology

## 2019-05-24 ENCOUNTER — Ambulatory Visit
Admission: RE | Admit: 2019-05-24 | Discharge: 2019-05-24 | Disposition: A | Discharge status: Home / Self Care | Payer: PRIVATE HEALTH INSURANCE | Source: Ambulatory Visit | Attending: Obstetrics and Gynecology | Admitting: Obstetrics and Gynecology

## 2019-05-24 DIAGNOSIS — Z1239 Encounter for other screening for malignant neoplasm of breast: Secondary | ICD-10-CM

## 2019-05-24 DIAGNOSIS — Z1231 Encounter for screening mammogram for malignant neoplasm of breast: Secondary | ICD-10-CM

## 2019-05-24 DIAGNOSIS — Z0000 Encounter for general adult medical examination without abnormal findings: Secondary | ICD-10-CM | POA: Insufficient documentation

## 2019-05-24 NOTE — Addendum Note (Signed)
Encounter addended by: Loletta Parish, RN on: 05/24/2019 1:16 PM  Actions taken: Clinical Note Signed

## 2019-05-24 NOTE — Progress Notes (Addendum)
No complaints today.   Pap Smear: Pap smear not completed today. Last Pap smear was 05/17/2019 at the [facility] and patient stated has no received results yet. Per patient has a history of an abnormal Pap smear around 20 years ago that required a colposcopy for follow-up. Per patient all of her Pap smears have been normal since colposcopy and has at least 3 normal Pap smears completed. Last Pap smear result is not in Epic. Pap smear results from 12/21/2015 and 04/20/2013 are in [facility].  Physical exam: Breasts Breasts symmetrical. No skin abnormalities bilateral breasts. No nipple retraction bilateral breasts. No nipple discharge bilateral breasts. No lymphadenopathy. No lumps palpated bilateral breasts. No complaints of pain or tenderness on exam. Referred patient to the [facility] for a screening mammogram. Appointment scheduled for Tuesday, May 24, 2019 at 1240.        Pelvic/Bimanual No Pap smear completed today since last Pap smear was 05/17/2019 per patient. Pap smear not indicated per BCCCP guidelines.   Smoking History: Patient has never smoked.  Patient Navigation: Patient education provided. Access to services provided for patient through [facility] program. Spanish interpreter provided.   Breast and Cervical Cancer Risk Assessment: Patient has no family history of breast cancer, known genetic mutations, or radiation treatment to the chest before age 54. Per patient has a history of cervical dysplasia. Patient has no history of being immunocompromised or DES exposure in-utero.  Risk Assessment    Risk Scores      05/24/2019 03/04/2018   Last edited by: Loletta Parish, RN Kainoa Swoboda, Heath Gold, RN   5-year risk: 0.3 % 0.3 %   Lifetime risk: 4.6 % 4.7 %         Used Spanish interpreter Rudene Anda from [place].

## 2019-05-24 NOTE — Patient Instructions (Signed)
Explained breast self awareness with Page Spiro. Patient did not need a Pap smear today due to last Pap smear was 05/17/19 per patient. Let her know BCCCP will cover Pap smears every 3 years unless has a history of abnormal Pap smears. Referred patient to the [facility] for a screening mammogram. Appointment scheduled for Tuesday, May 24, 2019 at 1240. Patient aware of appointment and will be there. Let [facility] know [facility] will follow up with her within the next couple weeks with results of her mammogram by letter or phone. [place] verbalized understanding.  Himani Corona, Arvil Chaco, RN 10:46 AM

## 2019-05-26 ENCOUNTER — Other Ambulatory Visit: Payer: Self-pay | Admitting: Obstetrics and Gynecology

## 2019-05-26 DIAGNOSIS — R928 Other abnormal and inconclusive findings on diagnostic imaging of breast: Secondary | ICD-10-CM

## 2019-05-30 ENCOUNTER — Other Ambulatory Visit: Payer: Self-pay

## 2019-05-30 ENCOUNTER — Other Ambulatory Visit: Payer: Self-pay | Admitting: Obstetrics and Gynecology

## 2019-05-30 ENCOUNTER — Ambulatory Visit
Admission: RE | Admit: 2019-05-30 | Discharge: 2019-05-30 | Disposition: A | Discharge status: Home / Self Care | Payer: PRIVATE HEALTH INSURANCE | Source: Ambulatory Visit | Attending: Obstetrics and Gynecology | Admitting: Obstetrics and Gynecology

## 2019-05-30 DIAGNOSIS — R928 Other abnormal and inconclusive findings on diagnostic imaging of breast: Secondary | ICD-10-CM

## 2019-05-30 DIAGNOSIS — N6489 Other specified disorders of breast: Secondary | ICD-10-CM

## 2019-07-04 ENCOUNTER — Inpatient Hospital Stay: Payer: Self-pay | Attending: Obstetrics and Gynecology | Admitting: *Deleted

## 2019-07-04 ENCOUNTER — Other Ambulatory Visit: Payer: Self-pay

## 2019-07-04 ENCOUNTER — Other Ambulatory Visit: Payer: Self-pay | Admitting: Obstetrics and Gynecology

## 2019-07-04 VITALS — BP 109/75 | Temp 98.0°F | Ht 62.5 in | Wt 160.0 lb

## 2019-07-04 DIAGNOSIS — Z0000 Encounter for general adult medical examination without abnormal findings: Secondary | ICD-10-CM

## 2019-07-04 NOTE — Progress Notes (Signed)
[facility] initial screening   interpreter- Rudene Anda, [employer]   Clinical Measurement:  Height: 62.5 in Weight: 160 lb  Blood Pressure: 113/77  Blood Pressure #2: 109/75 Fasting Labs Drawn Today, will review with patient when they result.   Medical History:  Patient states that she does not have a history of high blood pressure or diabetes. Patient states that she does not know if she has high cholesterol.  Medications:  Patient states that she does not take medication to lower cholesterol, blood pressure or blood sugar.  Patient does not take an aspirin a day to help prevent a heart attack or stroke.   Blood pressure, self measurement: Patient states that she does measure blood pressure from home and has not been told to do so by a health care provider.   Nutrition: Patient states that on average she eats 0 cups of fruit and 2 cups of vegetables per day. Patient states that she does not eat fish at least 2 times per week. Patient eats less than half servings of whole grains. Patient drinks less than 36 ounces of beverages with added sugar weekly. Patient is currently watching sodium or salt intake. In the past 7 days patient has had drinks containing alcohol on 2 days. On an average day that patient consumes alcohol she drinks 4 alcoholic beverages.  Physical activity:  Patient states that she gets 0 minutes of moderate and 0 minutes of vigorous physical activity each week.  Smoking status:  Patient states that she has never smoked tobacco.   Quality of life:  Over the past 2 weeks patient states that she has not had any days where she has little interest or pleasure in doing things and 0 days where she has felt down, depressed or hopeless.    Risk reduction and counseling:    Health Coaching: Spoke with patient about adding more fruits and vegetables in daily diet. Explained that the recommendation is for 2 cups of fruit and 3 cups of vegetables daily. Also encouraged patient to try adding  more heart healthy fish into weekly diet, like salmon, tuna or mackerel. Also encouraged patient to try adding more whole grains into diet. Patient currently does not eat any whole grains. Gave examples of whole grain bread and cereals, brown rice, whole grain pasta or oatmeal. Encouraged patient to try and start exercising for 20 minutes daily.   Navigation:  I will notify patient of lab results. Patient is aware of 2 more health coaching sessions and a follow up.  Time: 20 minutes

## 2019-07-05 ENCOUNTER — Telehealth (HOSPITAL_COMMUNITY): Payer: Self-pay

## 2019-07-05 LAB — LIPID PANEL W/O CHOL/HDL RATIO
Cholesterol, Total: 235 mg/dL — ABNORMAL HIGH (ref 100–199)
HDL: 32 mg/dL — ABNORMAL LOW (ref >39)
LDL Chol Calc (NIH): 96 mg/dL (ref 0–99)
Triglycerides: 637 mg/dL (ref 0–149)
VLDL Cholesterol Cal: 107 mg/dL — ABNORMAL HIGH (ref 5–40)

## 2019-07-05 LAB — HGB A1C W/O EAG: Hgb A1c MFr Bld: 5.4 % (ref 4.8–5.6)

## 2019-07-05 LAB — GLUCOSE, RANDOM: Glucose: 90 mg/dL (ref 65–99)

## 2019-07-05 NOTE — Telephone Encounter (Signed)
Spoke with patient via interpreter Rudene Anda to let her know that her lab work came back elevated. Explained that since her triglycerides were highly elevated we are going to repeat them before we refer for follow-up. Instructed patient to stop by office to pick up new lab form. Patient voiced understanding and will come by office on 07/07/19 to pick up lab form.

## 2019-07-12 ENCOUNTER — Other Ambulatory Visit: Payer: Self-pay | Admitting: Obstetrics and Gynecology

## 2019-07-13 LAB — LIPID PANEL W/O CHOL/HDL RATIO
Cholesterol, Total: 231 mg/dL — ABNORMAL HIGH (ref 100–199)
HDL: 44 mg/dL (ref >39)
LDL Chol Calc (NIH): 130 mg/dL — ABNORMAL HIGH (ref 0–99)
Triglycerides: 320 mg/dL — ABNORMAL HIGH (ref 0–149)
VLDL Cholesterol Cal: 57 mg/dL — ABNORMAL HIGH (ref 5–40)

## 2019-07-18 ENCOUNTER — Telehealth: Payer: Self-pay

## 2019-07-18 NOTE — Telephone Encounter (Signed)
Called patient via [employer] [phone] to make sure patient went to have labs redrawn for [facility]. Patient stated that she went last Tuesday, 07/12/19. Informed patient that I would call her this week when I get the results back. Patient voiced understanding.

## 2019-07-19 ENCOUNTER — Telehealth (HOSPITAL_COMMUNITY): Payer: Self-pay

## 2019-07-19 NOTE — Telephone Encounter (Signed)
Health coaching 2   interpreter- Rudene Anda, [place]- 231 cholesterol , 130 LDL cholesterol , 320 triglycerides , 44 HDL cholesterol , 5.4 hemoglobin A1C , 90 mean plasma glucose  Patient understands and is aware of her lab results.   Goals-  Discussed lab results with patient. Answered any questions that patient had regarding results. Goals- Reduce the amount of fried and fatty foods consumed. Try adding heart healthy fish into diet (salmon, tuna, mackerel). Add more whole grains into diet (whole wheat bread, brown rice, whole wheat pasta, oatmeal or whole grain cereals). Add more healthy fats into diet like olive oil and avocados. Start exercising for at least 20 minutes a day.    Navigation:  Patient is aware of 1 more health coaching sessions and a follow up. Will call patient with follow-up appointment information for Internal Medicine once appointment is scheduled.   Time- 10 minutes

## 2019-08-01 ENCOUNTER — Ambulatory Visit: Payer: PRIVATE HEALTH INSURANCE

## 2019-08-01 ENCOUNTER — Encounter: Payer: Self-pay | Admitting: General Practice

## 2019-09-05 ENCOUNTER — Ambulatory Visit: Payer: PRIVATE HEALTH INSURANCE

## 2019-10-11 ENCOUNTER — Encounter: Payer: Self-pay | Admitting: Internal Medicine

## 2019-10-11 ENCOUNTER — Ambulatory Visit (INDEPENDENT_AMBULATORY_CARE_PROVIDER_SITE_OTHER): Payer: Self-pay | Admitting: Internal Medicine

## 2019-10-11 ENCOUNTER — Other Ambulatory Visit: Payer: Self-pay

## 2019-10-11 VITALS — BP 127/91 | HR 93 | Temp 98.6°F | Ht 64.0 in | Wt 155.0 lb

## 2019-10-11 DIAGNOSIS — E785 Hyperlipidemia, unspecified: Secondary | ICD-10-CM

## 2019-10-11 MED ORDER — ATORVASTATIN CALCIUM 40 MG PO TABS: 40.0000 mg | ORAL_TABLET | Freq: Every day | ORAL | 1 refills | Status: DC

## 2019-10-11 NOTE — Assessment & Plan Note (Signed)
Patient presents for elevated Triglycerides and LDL screening labs. Per chart review she has been seen in our office in 2019 and had elevated lipids at that time as well. She was advised to make lifestyle modification at that time and follow up. She did not follow up and lipids have remained elevate. In addition, she was noted to have fatty liver infiltration on CT in 2018.  Based on her HDL (<50), TG (>150), and BP (Diastolic >85) she meets criteria for metabolic syndrome with goal LDL of <100. Given her elevated TGs and LDL no at goal, will start high intensity statin as this can lower TGs by 25-30% in people with TGs <400 and statins are preferred from those not at goal LDL. - Atorvastatin 40mg  Daily

## 2019-10-11 NOTE — Patient Instructions (Addendum)
Thank you for allowing [place] to care for you  For your high cholesterol and Triglycerides - Start Atorvastatin 40mg  Daily  Make an appointment with out financial counselor on your way out   Follow up here with new PCP in about 3 months   Traducido con el traductor de google  Gracias por permitirnos cuidar de usted  Para su colesterol alto y triglicridos - Inicie Atorvastatina 40 mg al da  una cita con [employer] asesor financiero al salir  Haga un seguimiento aqu con un nuevo PCP en aproximadamente 3 meses

## 2019-10-11 NOTE — Progress Notes (Signed)
   CC: Hyperlipidemia  HPI:  [name] is a 44 y.o. F with PMHx listed below presenting for hyperlipdemia. Please see the A&P for the status of the patient's chronic medical problems.  Patient advised to make appointment with financial counselor.  Past Medical History:  Diagnosis Date  . Hyperlipemia    Review of Systems:  Performed and all others negative.  Physical Exam:  Vitals:   10/11/19 1538  BP: (!) 127/91  Pulse: 93  Temp: 98.6 F (37 C)  TempSrc: Oral  SpO2: 100%  Weight: 155 lb (70.3 kg)  Height: 5\' 4"  (1.626 m)   Physical Exam Constitutional:      General: She is not in acute distress.    Appearance: Normal appearance.  Cardiovascular:     Rate and Rhythm: Normal rate and regular rhythm.     Pulses: Normal pulses.     Heart sounds: Normal heart sounds.  Pulmonary:     Effort: Pulmonary effort is normal. No respiratory distress.     Breath sounds: Normal breath sounds.  Abdominal:     General: Bowel sounds are normal. There is no distension.     Palpations: Abdomen is soft.     Tenderness: There is no abdominal tenderness.  Musculoskeletal:        General: No swelling or deformity.  Skin:    General: Skin is warm and dry.  Neurological:     General: No focal deficit present.     Mental Status: Mental status is at baseline.    Assessment & Plan:   See Encounters Tab for problem based charting.  Patient discussed with Dr. 

## 2019-10-12 NOTE — Progress Notes (Signed)
Internal Medicine Clinic Attending  Case discussed with Dr. Melvin  at the time of the visit.  We reviewed the resident's history and exam and pertinent patient test results.  I agree with the assessment, diagnosis, and plan of care documented in the resident's note.  

## 2019-11-07 ENCOUNTER — Ambulatory Visit: Payer: PRIVATE HEALTH INSURANCE

## 2019-11-27 ENCOUNTER — Other Ambulatory Visit: Payer: Self-pay

## 2019-11-27 ENCOUNTER — Encounter (HOSPITAL_COMMUNITY): Payer: Self-pay | Admitting: Emergency Medicine

## 2019-11-27 ENCOUNTER — Observation Stay (HOSPITAL_COMMUNITY)
Admission: EM | Admit: 2019-11-27 | Discharge: 2019-11-28 | Disposition: A | Discharge status: Home / Self Care | Payer: 59 | Attending: Family Medicine | Admitting: Family Medicine

## 2019-11-27 ENCOUNTER — Emergency Department (HOSPITAL_COMMUNITY): Payer: 59

## 2019-11-27 DIAGNOSIS — E781 Pure hyperglyceridemia: Secondary | ICD-10-CM | POA: Diagnosis not present

## 2019-11-27 DIAGNOSIS — Z20822 Contact with and (suspected) exposure to covid-19: Secondary | ICD-10-CM | POA: Diagnosis not present

## 2019-11-27 DIAGNOSIS — F1010 Alcohol abuse, uncomplicated: Secondary | ICD-10-CM | POA: Diagnosis not present

## 2019-11-27 DIAGNOSIS — K859 Acute pancreatitis without necrosis or infection, unspecified: Secondary | ICD-10-CM

## 2019-11-27 DIAGNOSIS — E785 Hyperlipidemia, unspecified: Secondary | ICD-10-CM | POA: Diagnosis not present

## 2019-11-27 DIAGNOSIS — K8500 Idiopathic acute pancreatitis without necrosis or infection: Secondary | ICD-10-CM

## 2019-11-27 DIAGNOSIS — K852 Alcohol induced acute pancreatitis without necrosis or infection: Principal | ICD-10-CM | POA: Insufficient documentation

## 2019-11-27 DIAGNOSIS — Z79899 Other long term (current) drug therapy: Secondary | ICD-10-CM | POA: Insufficient documentation

## 2019-11-27 HISTORY — DX: Acute pancreatitis without necrosis or infection, unspecified: K85.90

## 2019-11-27 LAB — COMPREHENSIVE METABOLIC PANEL
ALT: 38 U/L (ref 0–44)
AST: 46 U/L — ABNORMAL HIGH (ref 15–41)
Albumin: 4 g/dL (ref 3.5–5.0)
Alkaline Phosphatase: 71 U/L (ref 38–126)
Anion gap: 14 (ref 5–15)
BUN: <5 mg/dL — ABNORMAL LOW (ref 6–20)
CO2: 21 mmol/L — ABNORMAL LOW (ref 22–32)
Calcium: 9.1 mg/dL (ref 8.9–10.3)
Chloride: 102 mmol/L (ref 98–111)
Creatinine, Ser: 0.8 mg/dL (ref 0.44–1.00)
GFR calc Af Amer: >60 mL/min (ref >60)
GFR calc non Af Amer: >60 mL/min (ref >60)
Glucose, Bld: 121 mg/dL — ABNORMAL HIGH (ref 70–99)
Potassium: 3.8 mmol/L (ref 3.5–5.1)
Sodium: 137 mmol/L (ref 135–145)
Total Bilirubin: 0.7 mg/dL (ref 0.3–1.2)
Total Protein: 7.1 g/dL (ref 6.5–8.1)

## 2019-11-27 LAB — CBC
HCT: 35.5 % — ABNORMAL LOW (ref 36.0–46.0)
Hemoglobin: 11.7 g/dL — ABNORMAL LOW (ref 12.0–15.0)
MCH: 30.2 pg (ref 26.0–34.0)
MCHC: 33 g/dL (ref 30.0–36.0)
MCV: 91.5 fL (ref 80.0–100.0)
Platelets: 264 10*3/uL (ref 150–400)
RBC: 3.88 MIL/uL (ref 3.87–5.11)
RDW: 12.5 % (ref 11.5–15.5)
WBC: 13.4 10*3/uL — ABNORMAL HIGH (ref 4.0–10.5)
nRBC: 0 % (ref 0.0–0.2)

## 2019-11-27 LAB — I-STAT BETA HCG BLOOD, ED (MC, WL, AP ONLY): I-stat hCG, quantitative: <5 m[IU]/mL (ref <5)

## 2019-11-27 LAB — URINALYSIS, ROUTINE W REFLEX MICROSCOPIC
Bilirubin Urine: NEGATIVE
Glucose, UA: NEGATIVE mg/dL
Hgb urine dipstick: NEGATIVE
Ketones, ur: NEGATIVE mg/dL
Nitrite: NEGATIVE
Protein, ur: NEGATIVE mg/dL
Specific Gravity, Urine: 1.02 (ref 1.005–1.030)
pH: 6 (ref 5.0–8.0)

## 2019-11-27 LAB — LIPASE, BLOOD: Lipase: 135 U/L — ABNORMAL HIGH (ref 11–51)

## 2019-11-27 MED ORDER — SODIUM CHLORIDE 0.9 % IV SOLN: INTRAVENOUS | Status: DC

## 2019-11-27 MED ORDER — ACETAMINOPHEN 325 MG PO TABS: 650.0000 mg | ORAL_TABLET | Freq: Four times a day (QID) | ORAL | Status: DC | PRN

## 2019-11-27 MED ORDER — MORPHINE SULFATE (PF) 4 MG/ML IV SOLN
4.0000 mg | Freq: Once | INTRAVENOUS | Status: AC
  Administered 2019-11-27: 4 mg via INTRAVENOUS
  Filled 2019-11-27: qty 1

## 2019-11-27 MED ORDER — ONDANSETRON HCL 4 MG/2ML IJ SOLN
4.0000 mg | Freq: Once | INTRAMUSCULAR | Status: AC
  Administered 2019-11-27: 4 mg via INTRAVENOUS
  Filled 2019-11-27: qty 2

## 2019-11-27 MED ORDER — ACETAMINOPHEN 650 MG RE SUPP: 650.0000 mg | Freq: Four times a day (QID) | RECTAL | Status: DC | PRN

## 2019-11-27 MED ORDER — LACTATED RINGERS IV BOLUS
1000.0000 mL | Freq: Once | INTRAVENOUS | Status: AC
  Administered 2019-11-27: 1000 mL via INTRAVENOUS

## 2019-11-27 MED ORDER — ONDANSETRON HCL 4 MG PO TABS: 4.0000 mg | ORAL_TABLET | Freq: Four times a day (QID) | ORAL | Status: DC | PRN

## 2019-11-27 MED ORDER — SODIUM CHLORIDE 0.9% FLUSH: 3.0000 mL | Freq: Once | INTRAVENOUS | Status: DC

## 2019-11-27 MED ORDER — ONDANSETRON HCL 4 MG/2ML IJ SOLN: 4.0000 mg | Freq: Four times a day (QID) | INTRAMUSCULAR | Status: DC | PRN

## 2019-11-27 MED ORDER — MORPHINE SULFATE (PF) 2 MG/ML IV SOLN
2.0000 mg | INTRAVENOUS | Status: DC | PRN
  Administered 2019-11-27 – 2019-11-28 (×6): 2 mg via INTRAVENOUS
  Filled 2019-11-27 (×6): qty 1

## 2019-11-27 MED ORDER — ENOXAPARIN SODIUM 40 MG/0.4ML ~~LOC~~ SOLN
40.0000 mg | SUBCUTANEOUS | Status: DC
  Administered 2019-11-27: 40 mg via SUBCUTANEOUS
  Filled 2019-11-27: qty 0.4

## 2019-11-27 MED ORDER — SODIUM CHLORIDE 0.9 % IV BOLUS
1000.0000 mL | Freq: Once | INTRAVENOUS | Status: AC
  Administered 2019-11-27: 1000 mL via INTRAVENOUS

## 2019-11-27 MED ORDER — IOHEXOL 300 MG/ML  SOLN
100.0000 mL | Freq: Once | INTRAMUSCULAR | Status: AC | PRN
  Administered 2019-11-27: 100 mL via INTRAVENOUS

## 2019-11-27 NOTE — H&P (Addendum)
[facility] Admission History and Physical Service Pager: [phone]  Patient name: Nicole Velasquez Medical record number: [id number] Date of birth: 1976/04/02 Age: 44 y.o. Gender: female  Primary Care Provider: Jose Persia, MD Consultants: None Code Status: Full code  Chief Complaint: Abdominal pain and nausea  Assessment and Plan: Nicole Velasquez is a 44 y.o. female presenting with abdominal pain for 1 day. PMH is significant for hyper lipidemia and alcohol abuse.  Mild Acute pancreatitis likely due to alcohol abuse Patient presents with 1 day of epigastric abdominal pain in the setting of increasing her alcohol consumption to greater than 6 beers in one setting over the weekend. Patient also reported some pain radiating from her gastric region into her chest and shoulder.  Given patient's reported chest pain, will check EKG and troponin level.  Patient reports increasing her normal alcohol level to greater than 6 beers in one setting over the past weekend.  Abdominal CT confirmed mild focal acute pancreatitis involving the pancreatic head with no evidence of necrosis or pseudocyst. Acute pancreatitis is likely due to heavier than normal alcohol consumption, patient reports only increasing her EtOH consumption in the last few days.  Patient denies any history of high cholesterol however review of home medications includes prescription for atorvastatin.  Patient also denies history of gallstones/gallbladder disease.  In December 2020, patient was noted to have elevated triglycerides to 637, TG may have worsened in the interim and be contributing to pancreatitis as well. Patient denies any previous episodes of pancreatitis, no recent abdominal trauma/surgeries or infections.  Lipase elevated at 135 on admission. -Admit to MedSurg for observation, attending Dr. Ardelia Mems -Morphine 2 mg every 2 hours -Maintenance IV fluids at 150 mL/h, normal saline -1 L  normal saline bolus -CBC, CMP, HIV, repeat Lipase, Lipid panel -Tylenol as needed -We will start clear liquid diet in the morning if patient pain is improved -Encourage decrease alcohol consumption  Chest pain associated with epigastric pain  Patient reports chest pain that radiates from her epigastric region and goes to her right shoulder.  Patient denies any history of gallstones/gallbladder disease.  Will monitor EKG and troponin to make sure there is no cardiac contribution to this distribution of pain.  Patient denies any shortness of breath or left-sided chest pain on admission.  No cardiac medical history reported on admission. -f/u  troponin -EKG  Increased Alcohol Consumption  Patient reports recently increasing her consumption of alcohol from 1-2 beers in one setting to >6 beers each weekend day recently. Patient denies feelings of depression and has no clear reason for increasing her alcohol consumption. AST on admission mildly elevated to 46 with ALT within normal limits at 38. Some concern that patient may drink more than reported so will monitor CIWAs just in case. Last drink reported yesterday (2 beers) on 5/8.  -CIWA  -TOC consult for substances abuse due to alcohol consumption  -encourage decreased alcohol consumption   HLD  Patient home medications include Atorvastatin that patient last took one week ago.  Last lipid panel in December 2020 with total cholesterol elevated at 231, triglycerides of 320 and 637 prior to the, LDL was 130.  Patient may likely have elevated triglycerides, will follow lipid panel on admission. - lipid panel   FEN/GI: N.p.o.   Prophylaxis: Lovenox  Disposition: admit to med surg for observation  History of Present Illness:  Nicole Velasquez is a 44 y.o. female presenting with abdominal pain today. Felt like acid reflux,  but progressively worsened.  Patient reports that she normally drinks beers 2-3 times per week, but increased to greater  than 6 beers/day on the weekend most recently.  Patient's last drink was yesterday 5//21.  Patient states that she does not know why she increased her drinking and denies feelings of depression.  She reports that she has some epigastric pain that is 10/10 in severity and sometimes travels up towards the right side of her chest to her right shoulder.  Does have some pain that seems to go towards her lungs.  She has constant dull pain that has intermittent episodes of worsening and becoming sharp in character.  She reports that she woke up this morning at 8 AM had pain that worsened up until noon.  Reports that she initially had no appetite but now feels hunger.  Patient denies any vomiting.  Patient states that she did not take any medications to attempt to relieve her abdominal pain.  She reports that she takes no medications regularly (do note on home medications review that patient is prescribed atorvastatin).  Patient denies hematochezia/melena and does not report any constipation or diarrhea.  Last bowel movement was yesterday.  Surgical history is notable for C-section in 2017.  Patient denies any other history of abdominal surgeries/procedures.  Patient denies allergies to meds or foods.  Patient reports that she does not smoke cigarettes and has never been a smoker.  Does nto smoke cigarretes. Never smoker.  Patient denies recreational drug use.  Review Of Systems: Per HPI with the following additions:  Review of Systems  HENT: Negative for sore throat.   Eyes: Negative for blurred vision and pain.  Respiratory: Negative for cough, shortness of breath and wheezing.   Cardiovascular: Negative for chest pain and palpitations.  Gastrointestinal: Positive for abdominal pain. Negative for blood in stool, constipation, diarrhea, nausea and vomiting.  Genitourinary: Negative for dysuria and hematuria.  Skin: Negative for itching and rash.  Neurological: Negative for dizziness and headaches.   Psychiatric/Behavioral: Negative for depression.    Patient Active Problem List   Diagnosis Date Noted   Acute pancreatitis 11/27/2019   Alcohol-induced acute pancreatitis without infection or necrosis    Idiopathic acute pancreatitis without infection or necrosis    Screening breast examination 05/24/2019   Dyslipidemia 03/19/2018   Steatosis of liver 03/25/2017    Past Medical History: Past Medical History:  Diagnosis Date   Hyperlipemia     Past Surgical History: Past Surgical History:  Procedure Laterality Date   LAPAROTOMY N/A 11/30/2016   Procedure: LAPAROTOMY;  Surgeon: Adam Phenix, MD;  Location: [facility];  Service: Gynecology;  Laterality: N/A;    Social History: Social History   Tobacco Use   Smoking status: Never Smoker   Smokeless tobacco: Never Used  Substance Use Topics   Alcohol use: Yes    Comment: weekends   Drug use: No   Additional social history: Patient is increased to greater than 6 beers per day on weekends   Family History: Family History  Problem Relation Age of Onset   Diabetes Father    Breast cancer Neg Hx      Allergies and Medications: No Known Allergies No current facility-administered medications on file prior to encounter.   Current Outpatient Medications on File Prior to Encounter  Medication Sig Dispense Refill   atorvastatin (LIPITOR) 40 MG tablet Take 1 tablet (40 mg total) by mouth daily. 90 tablet 1   etonogestrel (NEXPLANON) 68 MG IMPL implant 68 mg  by Subdermal route once. Implanted April 04, 2019     Ketotifen Fumarate (ALLERGY EYE DROPS OP) Place 1 drop into both eyes daily as needed (allergies/itching).      Objective: BP 119/81 (BP Location: Right Arm)   Pulse 84   Temp 98.5 F (36.9 C) (Oral)   Resp 18   Ht 5\' 4"  (1.626 m)   Wt 150 lb (68 kg)   SpO2 100%   BMI 25.75 kg/m   Exam: General: Female appearing stated age, lying in bed in no acute distress initially but becoming visibly more  uncomfortable throughout interview does not appear to be in respiratory distress Eyes: No scleral icterus or conjunctival injection ENTM: Moist mucous membranes, do not appreciate oral lesions or oropharyngeal erythema Neck: Normal range of motion of neck Cardiovascular: Regular rate and rhythm, no murmurs, bilateral radial pulses palpable Respiratory: Clear to auscultation bilaterally, no wheezing, no crackles, stable on room air Gastrointestinal: Moderate epigastric tenderness, no tenderness in right and left lower quadrants, minimal bowel sounds appreciated, some guarding MSK: Moves upper and lower extremities with normal range of motion Derm: No lesions or ulcerations noted on exam Neuro: Alert and oriented x4 Psych: Thought content is linear and patient is cooperative and appropriate with examination, denies feelings of depression  Labs and Imaging: CBC BMET  Recent Labs  Lab 11/28/19 0302  WBC 12.3*  HGB 10.8*  HCT 32.9*  PLT 238   Recent Labs  Lab 11/28/19 0302  NA 139  K 3.4*  CL 109  CO2 20*  BUN <5*  CREATININE 0.67  GLUCOSE 94  CALCIUM 8.3*     CT ABDOMEN PELVIS W CONTRAST  Result Date: 11/27/2019 CLINICAL DATA:  Epigastric pain and nausea for 2 days. EXAM: CT ABDOMEN AND PELVIS WITH CONTRAST TECHNIQUE: Multidetector CT imaging of the abdomen and pelvis was performed using the standard protocol following bolus administration of intravenous contrast. CONTRAST:  01/27/2020 OMNIPAQUE IOHEXOL 300 MG/ML  SOLN COMPARISON:  02/27/2017 FINDINGS: Lower Chest: No acute findings. Hepatobiliary: No hepatic masses identified. Resolution of hepatic steatosis since prior study. Gallbladder is unremarkable. No evidence of biliary ductal dilatation. Pancreas: Swelling and peripancreatic edema is seen involving the pancreatic head, consistent with focal pancreatitis. No evidence of pancreatic necrosis or pseudocyst. No evidence of pancreatic mass or ductal dilatation. Spleen: Within normal  limits in size and appearance. Adrenals/Urinary Tract: No masses identified. No evidence of ureteral calculi or hydronephrosis. Stomach/Bowel: No evidence of obstruction, inflammatory process or abnormal fluid collections. Normal appendix visualized. Vascular/Lymphatic: No pathologically enlarged lymph nodes. No abdominal aortic aneurysm. Reproductive:  No mass or other significant abnormality. Other:  None. Musculoskeletal:  No suspicious bone lesions identified. IMPRESSION: Mild focal acute pancreatitis involving the pancreatic head. No evidence of pancreatic necrosis, pseudocyst, or other complication. Electronically Signed   By: 04/29/2017 M.D.   On: 11/27/2019 19:08    RESIDENT ATTESTATION   I have seen and examined this patient.    I have discussed the findings and exam with the intern and agree with the above note, which I have edited appropriately in BLUE. I helped develop the management plan that is described in the resident's note, and I agree with the content.    01/27/2020, MD 11/28/2019, 4:49 AM PGY-3, [facility] FPTS Intern pager: [phone], text pages welcome

## 2019-11-27 NOTE — ED Notes (Signed)
To CT

## 2019-11-27 NOTE — ED Provider Notes (Signed)
[facility] EMERGENCY DEPARTMENT Provider Note   CSN: [id number] Arrival date & time: 11/27/19  1648     History Chief Complaint  Patient presents with  . Abdominal Pain    Nicole Velasquez is a 44 y.o. female.  The history is provided by the patient.  Abdominal Pain Pain location:  Epigastric Pain quality: gnawing, sharp and shooting   Pain radiation: Radiates around both sides of the chest. Pain severity:  Severe Onset quality:  Gradual Duration:  18 hours Timing:  Constant Progression:  Worsening Chronicity:  New Context: alcohol use   Context comment:  Patient reports over the last few days she has been drinking heavier amount of alcohol.  She had 6 beers on Friday and then yesterday had 2 beers but she was not feeling good and had one episode of vomiting.  The pain started this morning Relieved by:  Nothing Worsened by:  Eating Ineffective treatments:  None tried Associated symptoms: anorexia   Associated symptoms: no cough, no diarrhea, no dysuria, no fever, no hematemesis, no nausea, no shortness of breath and no vomiting   Risk factors comment:  Typically drinks on the weekends.  History of fatty liver      Past Medical History:  Diagnosis Date  . Hyperlipemia     Patient Active Problem List   Diagnosis Date Noted  . Screening breast examination 05/24/2019  . Dyslipidemia 03/19/2018  . Steatosis of liver 03/25/2017    Past Surgical History:  Procedure Laterality Date  . LAPAROTOMY N/A 11/30/2016   Procedure: LAPAROTOMY;  Surgeon: Adam Phenix, MD;  Location: [facility];  Service: Gynecology;  Laterality: N/A;     OB History    Gravida  3   Para  3   Term  3   Preterm      AB      Living  3     SAB      TAB      Ectopic      Multiple      Live Births  3           Family History  Problem Relation Age of Onset  . Diabetes Father   . Breast cancer Neg Hx     Social History   Tobacco Use  . Smoking  status: Never Smoker  . Smokeless tobacco: Never Used  Substance Use Topics  . Alcohol use: Yes    Comment: weekends  . Drug use: No    Home Medications Prior to Admission medications   Medication Sig Start Date End Date Taking? Authorizing Provider  atorvastatin (LIPITOR) 40 MG tablet Take 1 tablet (40 mg total) by mouth daily. 10/11/19   Beola Cord, MD  etonogestrel (NEXPLANON) 68 MG IMPL implant 1 each by Subdermal route once.    [provider]  ibuprofen (ADVIL,MOTRIN) 800 MG tablet Take 1 tablet (800 mg total) by mouth 3 (three) times daily. Patient not taking: Reported on 03/04/2018 11/30/16   Rasch, Harolyn Rutherford, NP    Allergies    Patient has no known allergies.  Review of Systems   Review of Systems  Constitutional: Negative for fever.  Respiratory: Negative for cough and shortness of breath.   Gastrointestinal: Positive for abdominal pain and anorexia. Negative for diarrhea, hematemesis, nausea and vomiting.  Genitourinary: Negative for dysuria.  All other systems reviewed and are negative.   Physical Exam Updated Vital Signs BP (!) 135/93   Pulse 83   Temp 98.4  F (36.9 C) (Oral)   Resp 20   Ht 5\' 4"  (1.626 m)   Wt 68 kg   SpO2 100%   BMI 25.75 kg/m   Physical Exam Vitals and nursing note reviewed.  Constitutional:      General: She is not in acute distress.    Appearance: She is well-developed and normal weight.     Comments: Patient appears uncomfortable on exam  HENT:     Head: Normocephalic and atraumatic.  Eyes:     Pupils: Pupils are equal, round, and reactive to light.  Cardiovascular:     Rate and Rhythm: Normal rate and regular rhythm.     Heart sounds: Normal heart sounds. No murmur. No friction rub.  Pulmonary:     Effort: Pulmonary effort is normal.     Breath sounds: Normal breath sounds. No wheezing or rales.  Abdominal:     General: Bowel sounds are normal. There is no distension.     Palpations: Abdomen is soft.      Tenderness: There is abdominal tenderness in the epigastric area and left upper quadrant. There is guarding. There is no rebound. Negative signs include Murphy's sign.  Musculoskeletal:        General: No tenderness. Normal range of motion.     Comments: No edema  Skin:    General: Skin is warm and dry.     Findings: No rash.  Neurological:     Mental Status: She is alert and oriented to person, place, and time.     Cranial Nerves: No cranial nerve deficit.  Psychiatric:        Behavior: Behavior normal.      ED Results / Procedures / Treatments   Labs (all labs ordered are listed, but only abnormal results are displayed) Labs Reviewed  LIPASE, BLOOD - Abnormal; Notable for the following components:      Result Value   Lipase 135 (*)    All other components within normal limits  COMPREHENSIVE METABOLIC PANEL - Abnormal; Notable for the following components:   CO2 21 (*)    Glucose, Bld 121 (*)    BUN <5 (*)    AST 46 (*)    All other components within normal limits  CBC - Abnormal; Notable for the following components:   WBC 13.4 (*)    Hemoglobin 11.7 (*)    HCT 35.5 (*)    All other components within normal limits  URINALYSIS, ROUTINE W REFLEX MICROSCOPIC - Abnormal; Notable for the following components:   APPearance CLOUDY (*)    Leukocytes,Ua SMALL (*)    Bacteria, UA RARE (*)    All other components within normal limits  I-STAT BETA HCG BLOOD, ED ([facility], [facility], [facility] ONLY)    EKG None  Radiology CT ABDOMEN PELVIS W CONTRAST  Result Date: 11/27/2019 CLINICAL DATA:  Epigastric pain and nausea for 2 days. EXAM: CT ABDOMEN AND PELVIS WITH CONTRAST TECHNIQUE: Multidetector CT imaging of the abdomen and pelvis was performed using the standard protocol following bolus administration of intravenous contrast. CONTRAST:  01/27/2020 OMNIPAQUE IOHEXOL 300 MG/ML  SOLN COMPARISON:  02/27/2017 FINDINGS: Lower Chest: No acute findings. Hepatobiliary: No hepatic masses identified. Resolution of  hepatic steatosis since prior study. Gallbladder is unremarkable. No evidence of biliary ductal dilatation. Pancreas: Swelling and peripancreatic edema is seen involving the pancreatic head, consistent with focal pancreatitis. No evidence of pancreatic necrosis or pseudocyst. No evidence of pancreatic mass or ductal dilatation. Spleen: Within normal limits in size  and appearance. Adrenals/Urinary Tract: No masses identified. No evidence of ureteral calculi or hydronephrosis. Stomach/Bowel: No evidence of obstruction, inflammatory process or abnormal fluid collections. Normal appendix visualized. Vascular/Lymphatic: No pathologically enlarged lymph nodes. No abdominal aortic aneurysm. Reproductive:  No mass or other significant abnormality. Other:  None. Musculoskeletal:  No suspicious bone lesions identified. IMPRESSION: Mild focal acute pancreatitis involving the pancreatic head. No evidence of pancreatic necrosis, pseudocyst, or other complication. Electronically Signed   By: Marlaine Hind M.D.   On: 11/27/2019 19:08    Procedures Procedures (including critical care time)  Medications Ordered in ED Medications  sodium chloride flush (NS) 0.9 % injection 3 mL (3 mLs Intravenous Not Given 11/27/19 1826)  morphine 4 MG/ML injection 4 mg (has no administration in time range)  0.9 %  sodium chloride infusion (has no administration in time range)  morphine 4 MG/ML injection 4 mg (4 mg Intravenous Given 11/27/19 1832)  ondansetron (ZOFRAN) injection 4 mg (4 mg Intravenous Given 11/27/19 1832)  lactated ringers bolus 1,000 mL (1,000 mLs Intravenous New Bag/Given 11/27/19 1828)  iohexol (OMNIPAQUE) 300 MG/ML solution 100 mL (100 mLs Intravenous Contrast Given 11/27/19 1842)    ED Course  I have reviewed the triage vital signs and the nursing notes.  Pertinent labs & imaging results that were available during my care of the patient were reviewed by me and considered in my medical decision making (see chart for  details).    MDM Rules/Calculators/A&P                     Patient is a 44 year old female presenting today with significant epigastric pain that started when she woke up this morning that has persisted.  Patient has guarding in the upper abdomen area on exam.  However no significant right upper quadrant pain.  Low suspicion for cholecystitis today.  Concern for pancreatitis as patient admits to drinking fairly heavily over the weekend.  Also she denies any infectious symptoms.  She has had no urinary or vaginal complaints.  She denies any over-the-counter medication use such as NSAIDs or aspirin.  Patient given IV fluids and pain control.  Labs show a lipase of 135, mild elevation of AST but otherwise normal total bilirubin and ALT.  UA without significant findings, hCG is negative and CBC within normal limits.  CT shows focal area of pancreatitis in the head of the pancreas.  On repeat exam patient is still having pain and discomfort.  Will admit for pancreatitis and IV fluids. MDM Number of Diagnoses or Management Options   Amount and/or Complexity of Data Reviewed Clinical lab tests: ordered and reviewed Tests in the radiology section of CPT: ordered and reviewed Decide to obtain previous medical records or to obtain history from someone other than the patient: no Obtain history from someone other than the patient: no Review and summarize past medical records: yes Discuss the patient with other providers: yes Independent visualization of images, tracings, or specimens: yes  Risk of Complications, Morbidity, and/or Mortality Presenting problems: moderate Diagnostic procedures: low Management options: low  Patient Progress Patient progress: improved  Final Clinical Impression(s) / ED Diagnoses Final diagnoses:  Alcohol-induced acute pancreatitis without infection or necrosis    Rx / DC Orders ED Discharge Orders    None       Blanchie Dessert, MD 11/27/19 2005

## 2019-11-27 NOTE — ED Triage Notes (Signed)
Patient arrives to ED with complaints of epigastric abdominal pain that radiates to patient RUQ. The patient pain started this morning and has gotten worse. No complaints of N/V/D.

## 2019-11-27 NOTE — ED Notes (Signed)
Attempted report x1. 

## 2019-11-28 ENCOUNTER — Encounter (HOSPITAL_COMMUNITY): Payer: Self-pay | Admitting: Family Medicine

## 2019-11-28 DIAGNOSIS — R109 Unspecified abdominal pain: Secondary | ICD-10-CM

## 2019-11-28 DIAGNOSIS — E1169 Type 2 diabetes mellitus with other specified complication: Secondary | ICD-10-CM | POA: Diagnosis not present

## 2019-11-28 DIAGNOSIS — E785 Hyperlipidemia, unspecified: Secondary | ICD-10-CM

## 2019-11-28 DIAGNOSIS — R739 Hyperglycemia, unspecified: Secondary | ICD-10-CM

## 2019-11-28 DIAGNOSIS — K859 Acute pancreatitis without necrosis or infection, unspecified: Secondary | ICD-10-CM

## 2019-11-28 LAB — LIPID PANEL
Cholesterol: 151 mg/dL (ref 0–200)
HDL: 23 mg/dL — ABNORMAL LOW (ref >40)
LDL Cholesterol: UNDETERMINED mg/dL (ref 0–99)
Total CHOL/HDL Ratio: 6.6 RATIO
Triglycerides: 531 mg/dL — ABNORMAL HIGH (ref <150)
VLDL: UNDETERMINED mg/dL (ref 0–40)

## 2019-11-28 LAB — COMPREHENSIVE METABOLIC PANEL
ALT: 27 U/L (ref 0–44)
AST: 32 U/L (ref 15–41)
Albumin: 3.2 g/dL — ABNORMAL LOW (ref 3.5–5.0)
Alkaline Phosphatase: 75 U/L (ref 38–126)
Anion gap: 10 (ref 5–15)
BUN: <5 mg/dL — ABNORMAL LOW (ref 6–20)
CO2: 20 mmol/L — ABNORMAL LOW (ref 22–32)
Calcium: 8.3 mg/dL — ABNORMAL LOW (ref 8.9–10.3)
Chloride: 109 mmol/L (ref 98–111)
Creatinine, Ser: 0.67 mg/dL (ref 0.44–1.00)
GFR calc Af Amer: >60 mL/min (ref >60)
GFR calc non Af Amer: >60 mL/min (ref >60)
Glucose, Bld: 94 mg/dL (ref 70–99)
Potassium: 3.4 mmol/L — ABNORMAL LOW (ref 3.5–5.1)
Sodium: 139 mmol/L (ref 135–145)
Total Bilirubin: 1 mg/dL (ref 0.3–1.2)
Total Protein: 5.9 g/dL — ABNORMAL LOW (ref 6.5–8.1)

## 2019-11-28 LAB — CBC
HCT: 32.9 % — ABNORMAL LOW (ref 36.0–46.0)
Hemoglobin: 10.8 g/dL — ABNORMAL LOW (ref 12.0–15.0)
MCH: 30.1 pg (ref 26.0–34.0)
MCHC: 32.8 g/dL (ref 30.0–36.0)
MCV: 91.6 fL (ref 80.0–100.0)
Platelets: 238 10*3/uL (ref 150–400)
RBC: 3.59 MIL/uL — ABNORMAL LOW (ref 3.87–5.11)
RDW: 12.4 % (ref 11.5–15.5)
WBC: 12.3 10*3/uL — ABNORMAL HIGH (ref 4.0–10.5)
nRBC: 0 % (ref 0.0–0.2)

## 2019-11-28 LAB — RESPIRATORY PANEL BY RT PCR (FLU A&B, COVID)
Influenza A by PCR: NEGATIVE
Influenza B by PCR: NEGATIVE
SARS Coronavirus 2 by RT PCR: NEGATIVE

## 2019-11-28 LAB — LDL CHOLESTEROL, DIRECT: Direct LDL: 69.5 mg/dL (ref 0–99)

## 2019-11-28 LAB — LIPASE, BLOOD: Lipase: 139 U/L — ABNORMAL HIGH (ref 11–51)

## 2019-11-28 LAB — TROPONIN I (HIGH SENSITIVITY): Troponin I (High Sensitivity): 5 ng/L (ref <18)

## 2019-11-28 MED ORDER — OXYCODONE HCL 5 MG PO TABS: 5.0000 mg | ORAL_TABLET | ORAL | 0 refills | Status: AC | PRN

## 2019-11-28 MED ORDER — OXYCODONE HCL 5 MG PO TABS
5.0000 mg | ORAL_TABLET | ORAL | Status: DC | PRN
  Administered 2019-11-28: 5 mg via ORAL
  Filled 2019-11-28: qty 1

## 2019-11-28 MED ORDER — ONDANSETRON HCL 4 MG PO TABS: 4.0000 mg | ORAL_TABLET | Freq: Four times a day (QID) | ORAL | 0 refills | Status: DC | PRN

## 2019-11-28 NOTE — Hospital Course (Addendum)
[address] Nicole Velasquez is a 44 y.o. female with past medical history of hyperlipidemia who presented to the ED with abdominal pain and found to have acute, mild pancreatitis.  Mild acute pancreatitis  chest pain Patient presented with abdominal pain in the setting of recently increasing alcohol consumption to greater than 6 beers per weekend day.  Patient woke up on the morning of admission with abdominal pain that was in her epigastric region and sometimes radiated to her chest and right shoulder.  Patient had tried no medications to help alleviate the pain.  Reported one episode of vomiting upon presentation to the ED but had no further episodes of emesis prior to admission***.  EKG was within normal limits on admission.  Troponin was negative at***.  Patient was treated with 2 4 mg doses of morphine.  Patient continued to be treated with IV morphine and was started on maintenance IV fluids upon admission.  Labs were notable for elevated lipase of 135, and mild leukocytosis of 13.4.  Patient was also noted to have elevated glucose of 121 despite not eating prior to admission.  Hemoglobin A1c was checked and was***.  Lipid panel was also checked as patient has a history of elevated cholesterol in December 2020.  Lipid panel results showed***. patient was also treated with medication for nausea prevention.  Once patient was able to reach adequate IV medication pain control, she was switched to oral pain medications and clear liquid diet that she tolerated well and was able to be discharged home with recommendation for outpatient follow-up.***Lipase at the time of discharge had improved to***.     Concern for alcohol abuse Patient was monitored with CIWA scoring as she reported greater than 6 beers per weekend day which is thought to likely have contributed to onset of acute pancreatitis.  Transitions of care was consulted for education on substance use***

## 2019-11-28 NOTE — TOC Initial Note (Signed)
Transition of Care [facility]) - Initial/Assessment Note    Patient Details  Name: Nicole Velasquez MRN: [id number] Date of Birth: [birth year]  Transition of Care [facility]) CM/SW Contact:    Doy Hutching, LCSW Phone Number: 11/28/2019, 4:55 PM  Clinical Narrative:                 CSW introduced self, role, reason for visit. Pt speaks spanish and english, she did ask her daughter for some support. Daughter Verlon Au at bedside. Pt from home w/ spouse and children. She requests pt daughter to stay in room during my assessment. She admits to drinking, declines resources at this time.  Pt confirms home address and PCP. Inquires about orange card now that she has insurance, discussed that she would need to have her clinic run the benefits and that team here would schedule a f/u appointment when she was cleared. All questions answered. No further needs at this time.   Expected Discharge Plan: Home/Self Care Barriers to Discharge: Continued Medical Work up   Patient Goals and CMS Choice Patient states their goals for this hospitalization and ongoing recovery are:: feel better      Expected Discharge Plan and Services Expected Discharge Plan: Home/Self Care In-house Referral: Clinical Social Work Discharge Planning Services: CM Consult Post Acute Care Choice: NA Living arrangements for the past 2 months: Single Family Home                  Prior Living Arrangements/Services Living arrangements for the past 2 months: Single Family Home Lives with:: Spouse, Adult Children, Minor Children Patient language and need for interpreter reviewed:: Yes(pt speaks [place] and [place], pt asked her daughter for help. offered interpreter) Do you feel safe going back to the place where you live?: Yes      Need for Family Participation in Patient Care: Yes (Comment)(support/assistance) Care giver support system in place?: Yes (comment)(pt children and spouse and siblings)   Criminal Activity/Legal  Involvement Pertinent to Current Situation/Hospitalization: No - Comment as needed  Activities of Daily Living Home Assistive Devices/Equipment: None ADL Screening (condition at time of admission) Patient's cognitive ability adequate to safely complete daily activities?: Yes Is the patient deaf or have difficulty hearing?: No Does the patient have difficulty seeing, even when wearing glasses/contacts?: No Does the patient have difficulty concentrating, remembering, or making decisions?: No Patient able to express need for assistance with ADLs?: Yes Does the patient have difficulty dressing or bathing?: No Independently performs ADLs?: Yes (appropriate for developmental age) Does the patient have difficulty walking or climbing stairs?: No Weakness of Legs: None Weakness of Arms/Hands: None  Permission Sought/Granted Permission sought to share information with : Facility Medical sales representative, Family Supports Permission granted to share information with : Yes, Verbal Permission Granted  Share Information with NAME: Verlon Au     Permission granted to share info w Relationship: daughter     Emotional Assessment Appearance:: Appears stated age Attitude/Demeanor/Rapport: Engaged Affect (typically observed): Appropriate, Quiet Orientation: : Oriented to Self, Oriented to Place, Oriented to  Time, Oriented to Situation Alcohol / Substance Use: Alcohol Use Psych Involvement: No (comment)  Admission diagnosis:  Acute pancreatitis [K85.90] Alcohol-induced acute pancreatitis without infection or necrosis [K85.20] Patient Active Problem List   Diagnosis Date Noted  . Acute pancreatitis 11/27/2019  . Alcohol-induced acute pancreatitis without infection or necrosis   . Idiopathic acute pancreatitis without infection or necrosis   . Screening breast examination 05/24/2019  . Dyslipidemia 03/19/2018  . Steatosis of liver  03/25/2017   PCP:  Jose Persia, MD Pharmacy:   [facility] [address] [place] [address] Phone: [phone] Fax: [phone]  [facility] [address] [employer] [place] [address] [place]) [place] [address] Phone: [phone] Fax: [phone]    Readmission Risk Interventions No flowsheet data found.

## 2019-11-28 NOTE — Plan of Care (Signed)
  Problem: Education: Goal: Knowledge of General Education information will improve Description Including pain rating scale, medication(s)/side effects and non-pharmacologic comfort measures Outcome: Progressing   

## 2019-11-28 NOTE — Progress Notes (Signed)
Nicole Velasquez to be D/C'd  per MD order. Discussed with the patient and all questions fully answered.  VSS, Skin clean, dry and intact without evidence of skin break down, no evidence of skin tears noted.  IV catheter discontinued intact. Site without signs and symptoms of complications. Dressing and pressure applied.  An After Visit Summary was printed and given to the patient. Patient received prescription.  D/c education completed with patient/family including follow up instructions, medication list, d/c activities limitations if indicated, with other d/c instructions as indicated by MD - patient able to verbalize understanding, all questions fully answered.   Patient instructed to return to ED, call 911, or call MD for any changes in condition.   Patient to be escorted via WC, and D/C home via private auto.

## 2019-11-28 NOTE — Progress Notes (Addendum)
Family Medicine Teaching Service Daily Progress Note Intern Pager: [phone]  Patient name: Nicole Velasquez Medical record number: [id number] Date of birth: Jan 25, 1976 Age: 45 y.o. Gender: female  Primary Care Provider: Jose Persia, MD Consultants: None Code Status: Full  Pt Overview and Major Events to Date:  11/27/19 admitted for pancreatitis  Assessment and Plan: Nicole Velasquez is a 44 y.o. female presenting with abdominal pain for 1 day. PMH is significant for hyper lipidemia and alcohol abuse.  Mild Acute pancreatitis likely due to alcohol abuse This morning patient feels improved, pain is better controlled. Pain is intermittent cramping sensation in epigastric region. Patient really wants to eat now that she is feeling better. Plan to advance diet to CLD. If tolerates this without further emesis or uncontrolled pain, she can possibly be discharged this afternoon. -Morphine 2 mg every 2 hours -Maintenance IV fluids at 150 mL/h, normal saline -CBC, CMP, HIV, repeat Lipase, Lipid panel -Tylenol as needed -CLD, ADAT -Encourage decrease alcohol consumption -TOC consult for EtOH abuse  Chest pain associated with epigastric pain - resolved Patient reported chest pain that radiates from her epigastric region and went to her right shoulder.  Patient denies any history of gallstones/gallbladder disease.  EKG NSR and troponins WNL at 5. Likely referred pain from mild pancreatitis.  No cardiac medical history reported on admission. - monitor for return of chest pain  Increased Alcohol Consumption  Patient reports that she increased her drinking to 6 beers a day (at least) in February. Patient denies feelings of depression and has no clear reason for increasing her alcohol consumption. AST on admission mildly elevated to 46 with ALT within normal limits at 38. Some concern that patient may drink more than reported, CIWAs ordered, not yet charted.  Last drink reported yesterday  (2 beers) on 5/8.  -CIWA  -TOC consult for substances abuse due to alcohol consumption  -encourage decreased alcohol consumption   HLD  Patient home medications include Atorvastatin that patient last took one week ago.  Last lipid panel in December 2020 with total cholesterol elevated at 231, triglycerides of 320 and 637 prior to the, LDL was 130.  Patient may likely have elevated triglycerides, will follow lipid panel on admission. - lipid panel   Disposition: admit to med surg for observation, if tolerates diet, could possibly be discharged this afternoon  FEN/GI: CLD, ADAT, 125mL/hr PPx: Lovenox  Subjective:  Patient feeling much better overall today, really wants to eat and asking about discharge, would prefer to be home. Discussed alcohol as the most likely cause for this pain, patient did not seem surprised.  Objective: Temp:  [98.4 F (36.9 C)-98.9 F (37.2 C)] 98.5 F (36.9 C) (05/10 0439) Pulse Rate:  [73-89] 84 (05/10 0439) Resp:  [17-23] 18 (05/10 0439) BP: (119-135)/(81-95) 119/81 (05/10 0439) SpO2:  [98 %-100 %] 100 % (05/10 0439) Weight:  [68 kg] 68 kg (05/09 1656) Physical Exam: General: pleasant 44 yo latina resting comfortably in bed, NAD Cardiovascular: RRR, no m/r/g Respiratory: CTAB, no increased WOB Abdomen: soft, tenderness to palpation in epigastric region, ND, normal bowel sounds Extremities: warm, dry, no edema  Laboratory: Recent Labs  Lab 11/27/19 1704 11/28/19 0302  WBC 13.4* 12.3*  HGB 11.7* 10.8*  HCT 35.5* 32.9*  PLT 264 238   Recent Labs  Lab 11/27/19 1704 11/28/19 0302  NA 137 139  K 3.8 3.4*  CL 102 109  CO2 21* 20*  BUN <5* <5*  CREATININE 0.80 0.67  CALCIUM 9.1  8.3*  PROT 7.1 5.9*  BILITOT 0.7 1.0  ALKPHOS 71 75  ALT 38 27  AST 46* 32  GLUCOSE 121* 94   Imaging/Diagnostic Tests: CT ABDOMEN PELVIS W CONTRAST  Result Date: 11/27/2019 CLINICAL DATA:  Epigastric pain and nausea for 2 days. EXAM: CT ABDOMEN AND PELVIS  WITH CONTRAST TECHNIQUE: Multidetector CT imaging of the abdomen and pelvis was performed using the standard protocol following bolus administration of intravenous contrast. CONTRAST:  OMNIPAQUE IOHEXOL 300 MG/ML  SOLN COMPARISON:  02/27/2017 FINDINGS: Lower Chest: No acute findings. Hepatobiliary: No hepatic masses identified. Resolution of hepatic steatosis since prior study. Gallbladder is unremarkable. No evidence of biliary ductal dilatation. Pancreas: Swelling and peripancreatic edema is seen involving the pancreatic head, consistent with focal pancreatitis. No evidence of pancreatic necrosis or pseudocyst. No evidence of pancreatic mass or ductal dilatation. Spleen: Within normal limits in size and appearance. Adrenals/Urinary Tract: No masses identified. No evidence of ureteral calculi or hydronephrosis. Stomach/Bowel: No evidence of obstruction, inflammatory process or abnormal fluid collections. Normal appendix visualized. Vascular/Lymphatic: No pathologically enlarged lymph nodes. No abdominal aortic aneurysm. Reproductive:  No mass or other significant abnormality. Other:  None. Musculoskeletal:  No suspicious bone lesions identified. IMPRESSION: Mild focal acute pancreatitis involving the pancreatic head. No evidence of pancreatic necrosis, pseudocyst, or other complication. Electronically Signed   By: Danae Orleans M.D.   On: 11/27/2019 19:08   Shirlean Mylar, MD 11/28/2019, 9:53 AM PGY-1, [facility] Family Medicine FPTS Intern pager: [phone], text pages welcome

## 2019-11-28 NOTE — Discharge Instructions (Signed)
You were treated for pancreatitis while in the hospital. Most likely, this was caused by alcohol. We recommend not drinking alcohol.   If you have severe abdominal pain return, or have nausea/vomiting and cannot drink liquids in 24 hours, or have a fever, please go to your nearest hospital.   Fue tratado por pancreatitis mientras estaba en el hospital. Lo ms probable es que esto haya sido causado por el alcohol. Recomendamos no beber alcohol.  Si vuelve a tener dolor abdominal intenso, o tiene nuseas / vmitos y no puede beber lquidos en 24 horas, o tiene fiebre, dirjase al hospital ms cercano.  Plan de alimentacin para la pancreatitis Pancreatitis Eating Plan La pancreatitis ocurre cuando el pncreas se irrita e hincha (inflama). El pncreas es un rgano pequeo que est ubicado detrs del [place]. Ayuda al cuerpo a digerir los alimentos y regular el Banker. La pancreatitis puede afectar la forma en que el organismo digiere los alimentos, especialmente los alimentos con grasa. Tambin pueden presentarse otros sntomas, como dolor abdominal o nuseas. Cuando una persona tiene pancreatitis, seguir un plan de alimentacin con bajo contenido de grasa puede ayudarla a controlar los sntomas y a recuperarse ms rpidamente. Trabaje junto con el mdico o con un especialista en alimentacin y nutricin (nutricionista) a fin de crear un plan de alimentacin que sea adecuado para usted. Cules son algunos consejos para seguir este plan? Lea las etiquetas de los alimentos Utilice la informacin incluida en las etiquetas de los alimentos para Pharmacologist un registro de la cantidad de grasa que consume:  Consulte el tamao de la porcin.  Busque la cantidad de grasas totales en gramos (g) por porcin. ? Los alimentos con bajo contenido de [place] tienen 3 g de grasa o menos por porcin. ? Los alimentos sin contenido de [place] tienen 0.5 g de grasa o menos por porcin.  Lleve un registro de  la cantidad de grasa que consume en funcin de la cantidad de porciones que come. ? Por ejemplo, si come 2 porciones, la cantidad de grasa que consume ser [employer] lo indicado en la etiqueta. Al ir de compras   Compre alimentos con bajo contenido de grasa o sin grasa, por ejemplo: ? Nils Pyle y verduras frescas, congeladas o enlatadas. ? Cereales, que incluyan panes, pastas y arroz. ? [employer], carne de ave, pescado y otros alimentos proteicos. ? Productos lcteos semidescremados o descremados.  Evite comprar productos de panadera y otros dulces hechos con [place], [place] y [place].  Evite comprar tentempis con grasa agregada, como cualquier alimento con [place] o con sabor a queso. Al cocinar  Quite la piel de las aves y el exceso de grasa de la carne.  Limite la cantidad de grasa y aceite que [place] a 6 cucharaditas o menos por Futures trader.  Para cocinar, opte por mtodos con bajo contenido de grasa, como hervir, asar, Software engineer, cocer al vapor y Development worker, community.  Use aceite en aerosol para cocinar. Agregue caldo de pollo desgrasado para darle ms sabor y la humedad a Chemical engineer.  Evite agregar crema para espesar las sopas y salsas. Use otros productos para espesar, por ejemplo, almidn de maz o pasta de tomate. Planificacin de las comidas   Consuma una dieta con bajo contenido de grasa como se lo haya indicado el nutricionista. En la [employer], esto significa no consumir ms de 55 a 65 gramos de Academic librarian.  Coma comidas pequeas, con frecuencia, durante todo el da. Por ejemplo,  puede comer 5 o 6 comidas pequeas en lugar de 3 comidas abundantes.  Beba suficiente lquido como para Theatre manager la orina de color amarillo plido.  No beba alcohol. Si necesita ayuda para dejar de hacerlo, hable con el mdico.  Limite la cantidad de cafena que consume, por ejemplo, caf negro, t negro y t verde, bebidas que contengan cafena y bebidas energizantes. Informacin  general  Informe a su mdico o al nutricionista si tiene una prdida de peso no planificada con este plan de alimentacin.  Tal vez le indique que siga una dieta con lquidos claros durante una agudizacin de los sntomas. Hable con el mdico sobre cmo controlar su dieta durante la agudizacin de los sntomas.  Tome vitaminas o suplementos como se lo haya indicado el mdico.  Trabaje con un nutricionista, especialmente si tiene otras enfermedades, como obesidad o diabetes mellitus. Qu alimentos debo evitar? Lambert Mody Frutas fritas. Frutas servidas con mantequilla o crema. Verduras Verduras fritas. Verduras cocinadas con [place], queso o crema. Granos Bizcochos, waffles, donas, pasteles y medialunas. Tartas y galletitas. Palomitas de maz saborizadas con mantequilla. Galletas comunes. Carnes y otros alimentos ricos en protenas [place] de carne con alto contenido de [place]. Carne de ave con piel. Vsceras. Panceta, salchichas y fiambres. Huevos enteros. Frutos secos y Engineer, mining de frutos secos. Lcteos [place] entera y [place] con 2% de grasa. [place] Helados de Mattel. Crema y Malin de crema y [place]. Queso crema. Phoebe Sharps. Queso. Bebidas Vino, cerveza y licor. Es posible que los productos que se enumeran ms arriba no sean una lista completa de los alimentos y las bebidas que se Higher education careers adviser. Consulte a un nutricionista para obtener ms informacin. Resumen  La pancreatitis puede afectar la forma en que el organismo digiere los alimentos, especialmente los alimentos con grasa.  Cuando una persona tiene pancreatitis, es recomendable que siga un plan de alimentacin con bajo contenido de grasa para ayudar a recuperarse ms rpidamente y a Emergency planning/management officer. En la [employer], esto significa limitar el consumo de grasa a no ms de 55 a 65 gramos por Training and development officer.  No beba alcohol. Limite la cantidad de cafena que consume y beba suficiente lquido para  Theatre manager la orina de color amarillo plido. Esta informacin no tiene Marine scientist el consejo del mdico. Asegrese de hacerle al mdico cualquier pregunta que tenga. Document Revised: 11/13/2017 Document Reviewed: 11/13/2017 Elsevier Patient Education  [place].

## 2019-11-29 ENCOUNTER — Telehealth: Payer: Self-pay | Admitting: *Deleted

## 2019-11-29 ENCOUNTER — Ambulatory Visit: Payer: PRIVATE HEALTH INSURANCE

## 2019-11-29 ENCOUNTER — Other Ambulatory Visit: Payer: Self-pay

## 2019-11-29 ENCOUNTER — Ambulatory Visit
Admission: RE | Admit: 2019-11-29 | Discharge: 2019-11-29 | Disposition: A | Discharge status: Home / Self Care | Payer: 59 | Source: Ambulatory Visit | Attending: Obstetrics and Gynecology | Admitting: Obstetrics and Gynecology

## 2019-11-29 DIAGNOSIS — N6489 Other specified disorders of breast: Secondary | ICD-10-CM

## 2019-11-29 LAB — HIV ANTIBODY (ROUTINE TESTING W REFLEX): HIV Screen 4th Generation wRfx: NONREACTIVE

## 2019-11-29 NOTE — Telephone Encounter (Signed)
Patient called regarding not being able to pick up Rx from pharmacy.  RNCM contacted [facility] on [address] to find that they were in need of a diagnosis.  RNCM provided pancreatitis as diagnosis and pharmacy has agreed to dispense as written.

## 2019-12-01 NOTE — Discharge Summary (Signed)
Family Medicine Teaching [facility] Discharge Summary  Patient name: Nicole Velasquez Medical record number: [id number] Date of birth: 03-13-1976 Age: 44 y.o. Gender: female Date of Admission: 11/27/2019  Date of Discharge: 5/10 Admitting Physician: Nicki Guadalajara, MD  Primary Care Provider: Verdene Lennert, MD Consultants: none  Indication for Hospitalization: pancreatitis  Discharge Diagnoses/Problem List:  Pancreatitis EtOH use HLD  Disposition: to home  Discharge Condition: stable and improved  Discharge Exam:  Vitals:   11/28/19 1000 11/28/19 1328  BP: 122/80 122/82  Pulse: 81 83  Resp: 18 19  Temp: 98.4 F (36.9 C) 98 F (36.7 C)  SpO2: 100% 99%   Brief Hospital Course:  Mild acute pancreatitis  chest pain Patient presented with abdominal pain in the setting of recently increasing alcohol consumption to greater than 6 beers per day.  Patient woke up on the morning of admission with abdominal pain that was in her epigastric region and sometimes radiated to her chest and right shoulder.  Patient had tried no medications to help alleviate the pain.  Reported one episode of vomiting upon presentation to the ED but had no further episodes of emesis prior to admission.  EKG was within normal limits on admission.  Troponin was negative at 5, cardiac cause very unlikely.  Patient treated with IV morphine and was started on maintenance IV fluids upon admission.  Labs were notable for elevated lipase of 135, and mild leukocytosis of 13.4.  Patient was also noted to have elevated glucose of 121 despite not eating prior to admission.  Lipid panel was also checked as patient has a history of elevated cholesterol in December 2020.  Lipid panel results showed total cholesterol WNL at 151, HDL low at 23, LDL WNL at 03.5, and elevated triglycerides at 531. Patient was also treated with antiemetics. Patient's pain was well controlled with oxycodone 5mg  IR PO the next day, and she  tolerated a soft/bland diet. Patient discharged with 3 days of oxycodone and zofran.  Concern for alcohol abuse Patient was monitored with CIWA scoring as she reported greater than 6 beers per weekend day which is thought to likely have contributed to onset of acute pancreatitis.  Social work was consulted for education on substance use; patient received appropriate resources.  Issues for Follow Up:  1. Pancreatitis: ensuring patient continues to have adequate pain control and continues to tolerate PO. 2. EtOH: continue to assess patient for EtOH SUD and consideration of cutting back or abstinence.  Significant Procedures: none  Significant Labs and Imaging:  Recent Labs  Lab 11/27/19 1704 11/28/19 0302  WBC 13.4* 12.3*  HGB 11.7* 10.8*  HCT 35.5* 32.9*  PLT 264 238   Recent Labs  Lab 11/27/19 1704 11/28/19 0302  NA 137 139  K 3.8 3.4*  CL 102 109  CO2 21* 20*  GLUCOSE 121* 94  BUN <5* <5*  CREATININE 0.80 0.67  CALCIUM 9.1 8.3*  ALKPHOS 71 75  AST 46* 32  ALT 38 27  ALBUMIN 4.0 3.2*   EXAM: CT ABDOMEN AND PELVIS WITH CONTRAST  TECHNIQUE: Multidetector CT imaging of the abdomen and pelvis was performed using the standard protocol following bolus administration of intravenous contrast.  CONTRAST:  01/28/20 OMNIPAQUE IOHEXOL 300 MG/ML  SOLN  COMPARISON:  02/27/2017  FINDINGS: Lower Chest: No acute findings.  Hepatobiliary: No hepatic masses identified. Resolution of hepatic steatosis since prior study. Gallbladder is unremarkable. No evidence of biliary ductal dilatation.  Pancreas: Swelling and peripancreatic edema is seen involving the  pancreatic head, consistent with focal pancreatitis. No evidence of pancreatic necrosis or pseudocyst. No evidence of pancreatic mass or ductal dilatation.  Spleen: Within normal limits in size and appearance.  Adrenals/Urinary Tract: No masses identified. No evidence of ureteral calculi or  hydronephrosis.  Stomach/Bowel: No evidence of obstruction, inflammatory process or abnormal fluid collections. Normal appendix visualized.  Vascular/Lymphatic: No pathologically enlarged lymph nodes. No abdominal aortic aneurysm.  Reproductive:  No mass or other significant abnormality.  Other:  None.  Musculoskeletal:  No suspicious bone lesions identified.  IMPRESSION: Mild focal acute pancreatitis involving the pancreatic head.  No evidence of pancreatic necrosis, pseudocyst, or other complication.  Results/Tests Pending at Time of Discharge: none  Discharge Medications:  Allergies as of 11/28/2019   No Known Allergies     Medication List    TAKE these medications   ALLERGY EYE DROPS OP Place 1 drop into both eyes daily as needed (allergies/itching).   atorvastatin 40 MG tablet Commonly known as: LIPITOR Take 1 tablet (40 mg total) by mouth daily.   etonogestrel 68 MG Impl implant Commonly known as: NEXPLANON 68 mg by Subdermal route once. Implanted April 04, 2019   ondansetron 4 MG tablet Commonly known as: ZOFRAN Take 1 tablet (4 mg total) by mouth every 6 (six) hours as needed for nausea.   oxyCODONE 5 MG immediate release tablet Commonly known as: Oxy IR/ROXICODONE Take 1 tablet (5 mg total) by mouth every 4 (four) hours as needed for up to 3 days for moderate pain or severe pain.       Discharge Instructions: Please refer to Patient Instructions section of EMR for full details.  Patient was counseled important signs and symptoms that should prompt return to medical care, changes in medications, dietary instructions, activity restrictions, and follow up appointments.   Follow-Up Appointments: Follow-up Information    Jose Persia, MD. Schedule an appointment as soon as possible for a visit.   Specialty: Internal Medicine Why: in 1-2 semanas Contact information: [address] [place] [address] [phone]            Gladys Damme, MD 12/01/2019, 9:40 PM PGY-1, [place]

## 2020-01-11 ENCOUNTER — Encounter: Payer: PRIVATE HEALTH INSURANCE | Admitting: Internal Medicine

## 2020-02-06 ENCOUNTER — Other Ambulatory Visit: Payer: No Typology Code available for payment source

## 2020-02-15 ENCOUNTER — Telehealth: Payer: Self-pay

## 2020-02-15 NOTE — Telephone Encounter (Signed)
Left message for patient about completing HC 3 for the [employer] program. Left name and number for patient to call back. 

## 2020-04-30 ENCOUNTER — Telehealth: Payer: Self-pay

## 2020-04-30 NOTE — Telephone Encounter (Signed)
Health Coaching 3  interpreter- [name], [employer]   Goals- Patient states she has reduced the amount of fried and fatty foods that she consumes. Patient states that she has adding in more lean protein like chicken and fish in diet.    New goal- Continue trying to add fruits and vegetables in daily diet, as well as whole grains. Continue trying to eat a well balanced diet From all food groups.   Barrier to reaching goal-  NA   Strategies to overcome- NA   Navigation:  Patient is aware of  a follow up session. Patient is scheduled for follow-up visit on Monday, May 21, 2020 @ 9:15 am.   Time- 15 minutes

## 2020-05-01 ENCOUNTER — Telehealth: Payer: Self-pay

## 2020-05-01 NOTE — Telephone Encounter (Signed)
Patient called complaining of bilateral breast pain. Patient stated that she now has the [employer] through a previous job that is good through November. Informed patient via interpreter [name] that she is not eligible for BCCCP due to having insurance. Informed patient that she will need to use her insurance for mammogram and that she will need to see her PCP first. Patient voiced understanding.

## 2020-05-21 ENCOUNTER — Ambulatory Visit: Payer: No Typology Code available for payment source

## 2020-06-20 ENCOUNTER — Inpatient Hospital Stay: Payer: 59 | Attending: Obstetrics and Gynecology | Admitting: *Deleted

## 2020-06-20 ENCOUNTER — Other Ambulatory Visit: Payer: Self-pay

## 2020-06-20 VITALS — BP 122/84 | Ht 62.5 in | Wt 165.5 lb

## 2020-06-20 DIAGNOSIS — Z0000 Encounter for general adult medical examination without abnormal findings: Secondary | ICD-10-CM

## 2020-06-20 NOTE — Progress Notes (Addendum)
[facility] follow up   Interpreter: [name], Haroldine Laws  Clinical Measurement:   Vitals:   06/20/20 0904 06/20/20 0922  BP: 126/82 122/84      Medical History:  Patient states that she has high cholesterol, does not have high blood pressure and she does not have diabetes.  Medications:  Patient states that she does take medication to lower cholesterol. Patient does not take medication to lower blood pressure or blood sugar.  Patient does not take an aspirin a day to help prevent a heart attack or stroke. During the past 7 days patient has taken prescribed medication to lower cholesterol on 2 days.   Blood pressure, self measurement: Patient states that she does not measure blood pressure from home. She checks her blood pressure N/A. She shares her readings with a health care provider: N/A.   Nutrition: Patient states that on average she eats 0 cups of fruit and 1 cups of vegetables per day. Patient states that she does not eat fish at least 2 times per week. Patient eats less than half servings of whole grains. Patient drinks less than 36 ounces of beverages with added sugar weekly: yes. Patient is currently watching sodium or salt intake: yes. In the past 7 days patient has had 1 drinks containing alcohol. On average patient drinks 6 drinks containing alcohol per day.      Physical activity:  Patient states that she gets 210 minutes of moderate and 0 minutes of vigorous physical activity each week.  Smoking status:  Patient states that she has has never smoked .   Quality of life:  Over the past 2 weeks patient states that she had little interest or pleasure in doing things: several days. She has been feeling down, depressed or hopeless:several days.    Risk reduction and counseling:   Health Coaching: Spoke with patient about daily recommendation for fruits and vegetables. Explained that the recommendation is for 2 cups of fruit and 3 cups of vegetables per day. Patient does not like  many of the heart healthy fish choices. If she consumes fish she prefers tilapia. Patient currently consumes oatmeal for whole grains. Gave recommendation for some other grains she can try to add in diet like brown rice, whole wheat bread or pasta and whole grain cereals.  Encouraged patient to continue with daily exercise.   Patient stated that she had only taken her cholesterol medication 2 days in the last 7 days. Asked patient why she has not been taking her medication daily. Patient stated that it makes her very sleepy even when she takes it at night before bed. Encouraged her to reach out to Internal Medicine and let them know how the medication has been making her feel. Patient said that she would call them.   Navigation: This was the  follow up session for this patient, I will check up on her progress in the coming months.  Time: 20 minutes

## 2020-07-17 ENCOUNTER — Other Ambulatory Visit: Payer: Self-pay | Admitting: Obstetrics & Gynecology

## 2020-07-17 DIAGNOSIS — Z1231 Encounter for screening mammogram for malignant neoplasm of breast: Secondary | ICD-10-CM

## 2020-10-29 IMAGING — MG MM DIGITAL DIAGNOSTIC UNILAT*R* W/ TOMO W/ CAD
4 series · 4 of 12 positions shown · non-contrast
Comparison: Previous exam(s).

CLINICAL DATA: Patient presents today recall from screening for a
possible right breast asymmetry.

EXAM:
DIGITAL DIAGNOSTIC RIGHT MAMMOGRAM WITH TOMO
ULTRASOUND RIGHT BREAST

[R CC synth-2D]
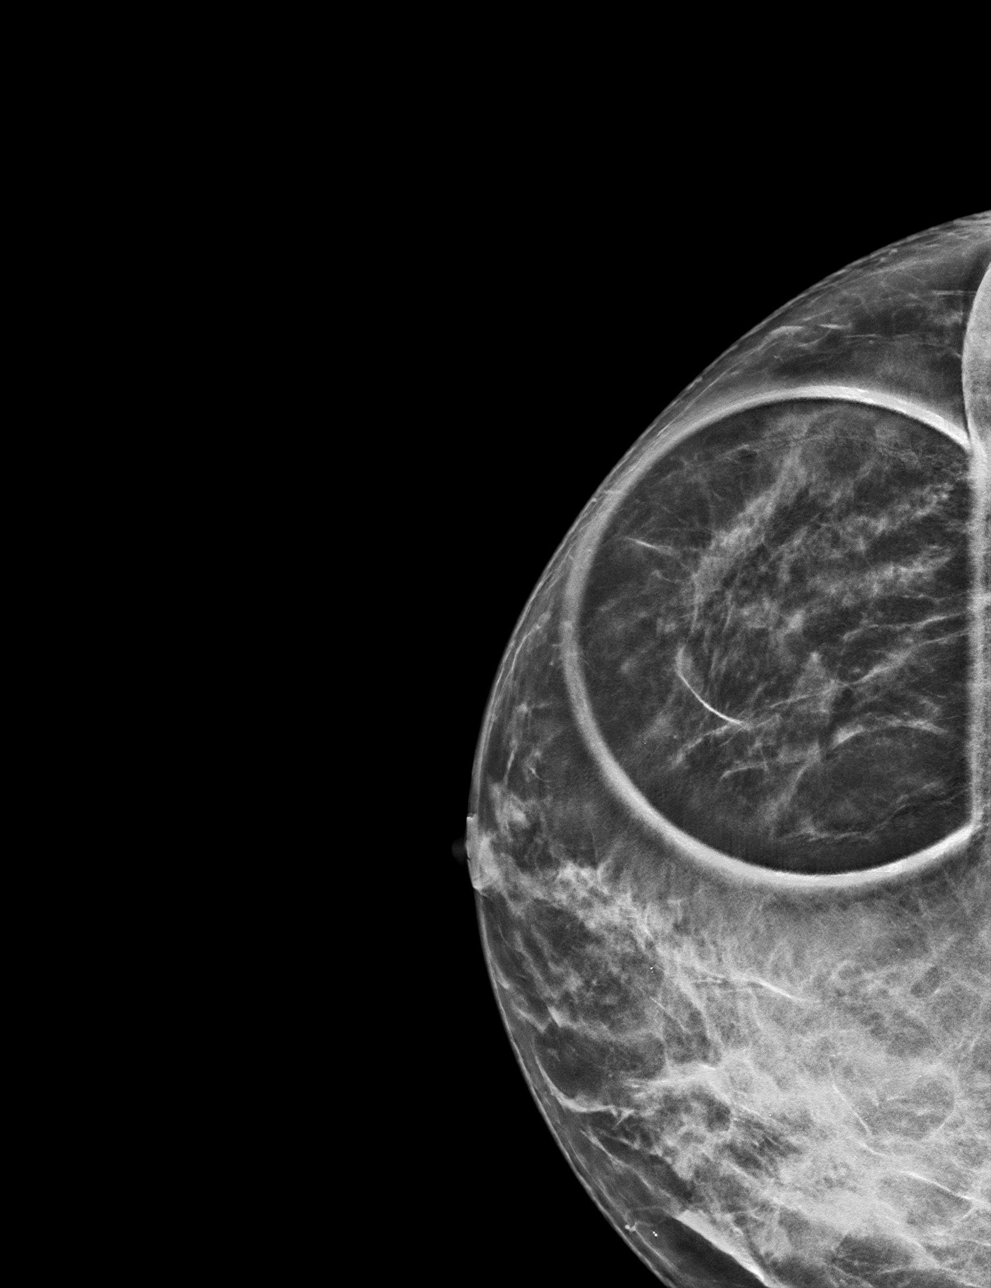

[R ML synth-2D]
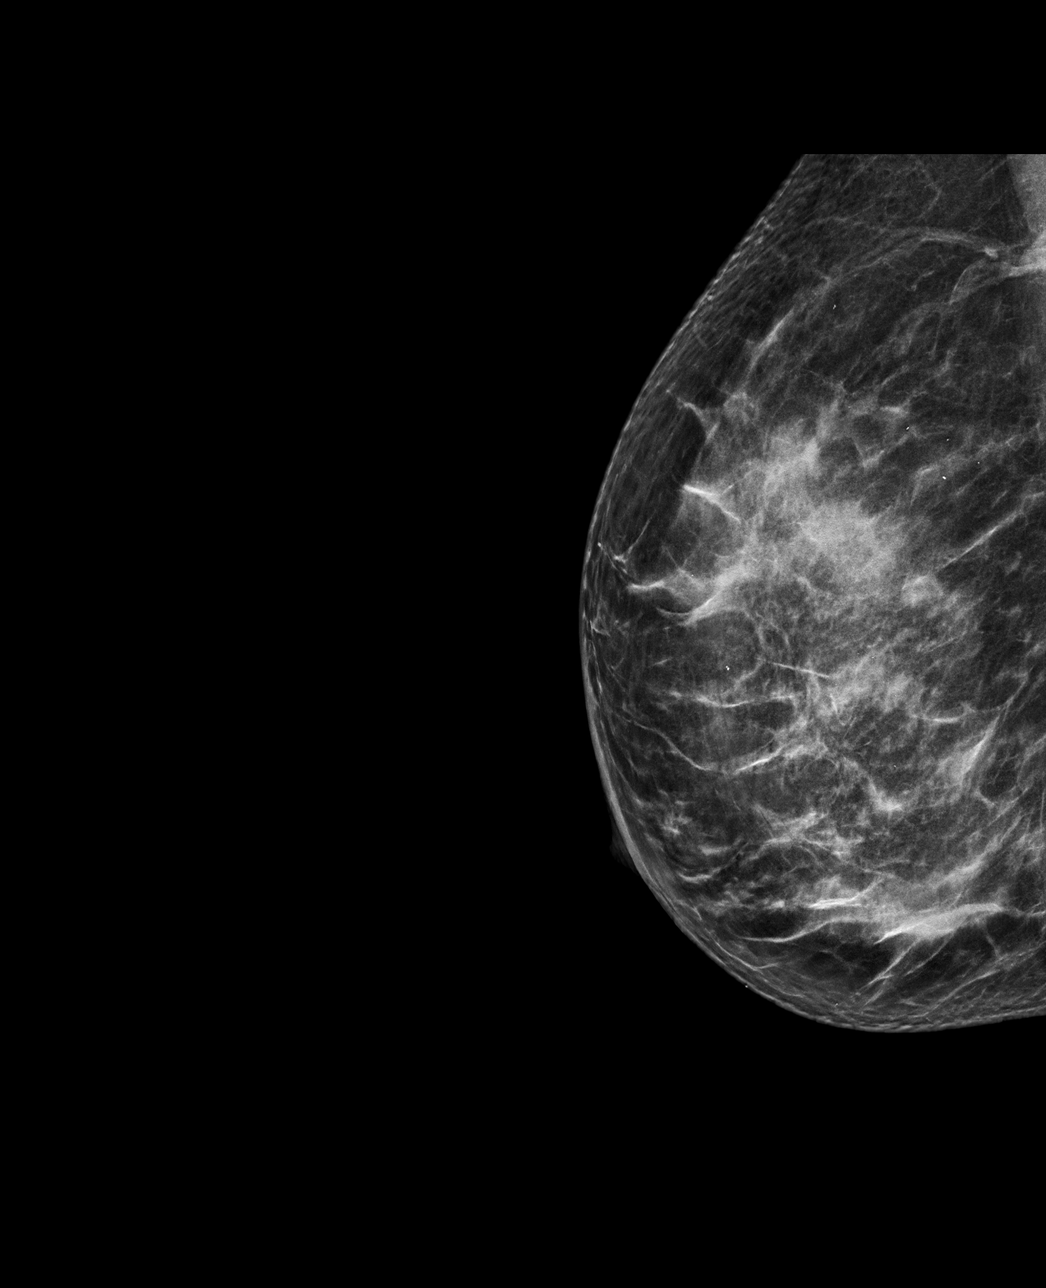

[R ML tomo · tomo slice 35/70.0]
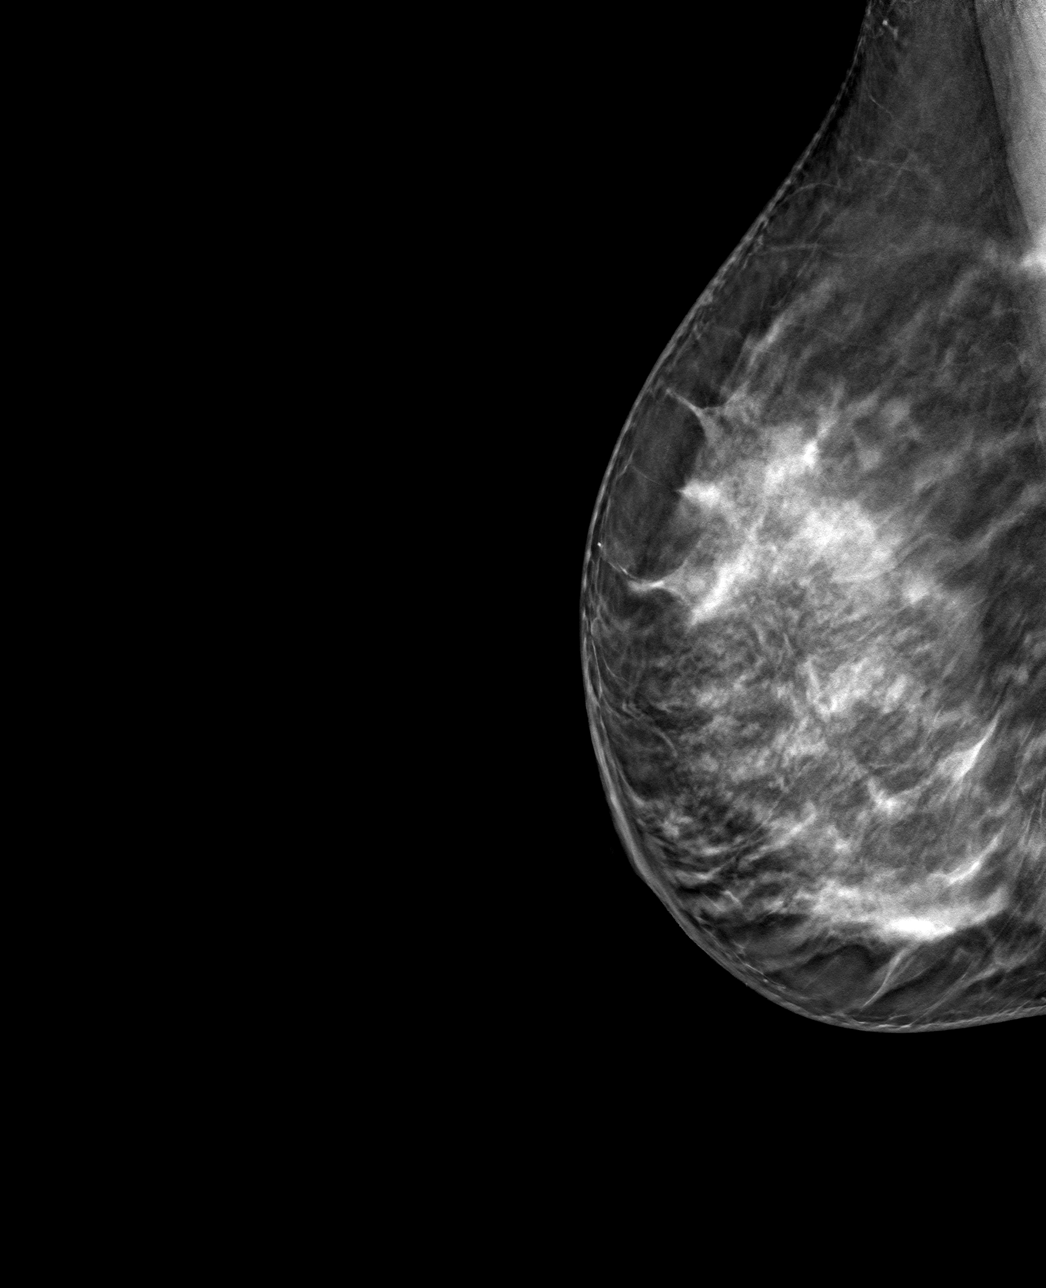

[R CC tomo · tomo slice 31/60.0]
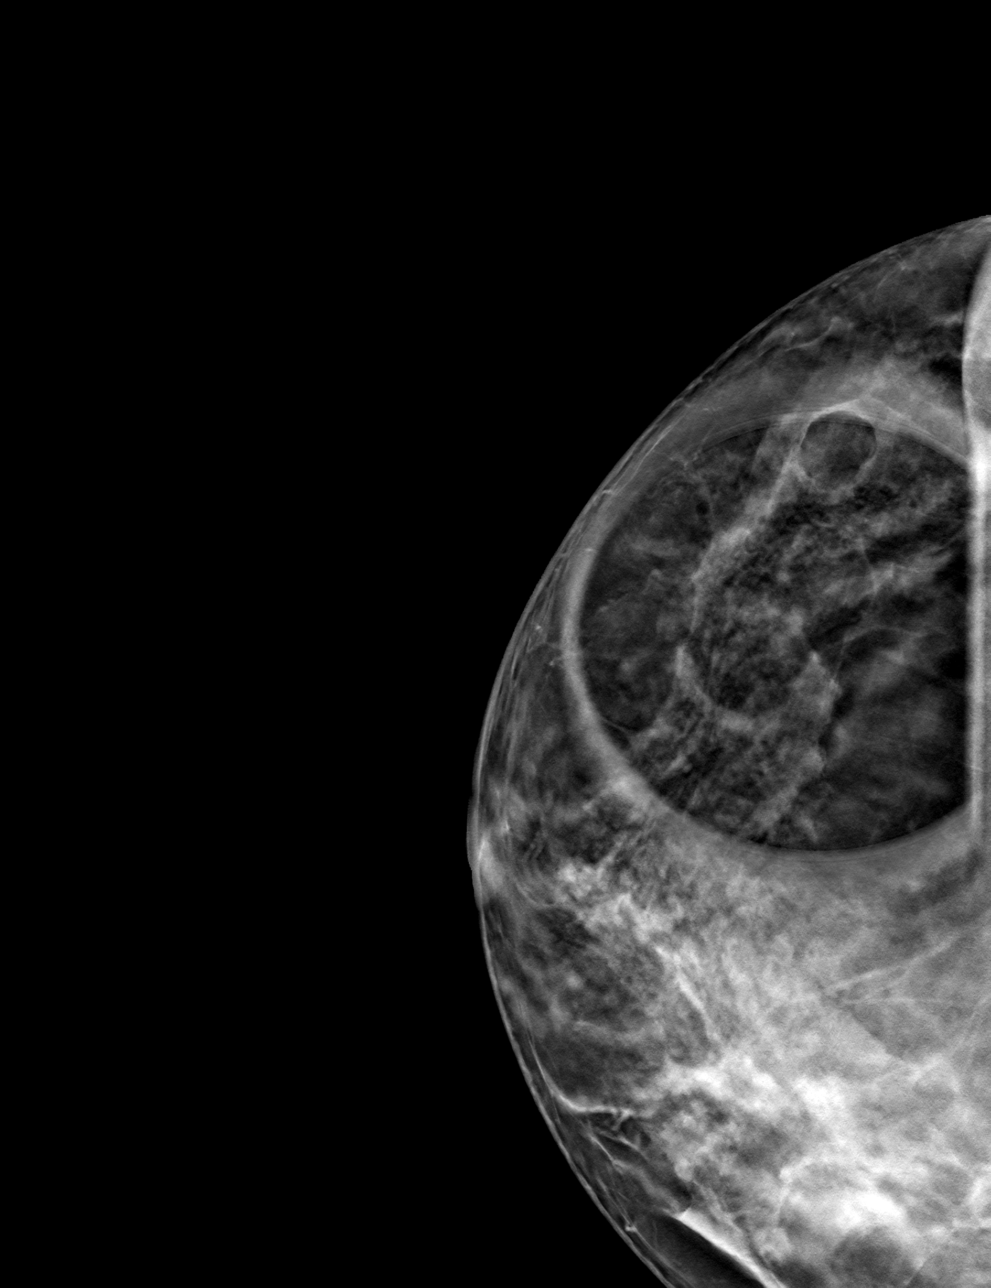

[4 of 12 positions shown; findings below may reference images not displayed]

ACR Breast Density Category c: The breast tissue is heterogeneously
dense, which may obscure small masses.
FINDINGS: Mammogram: Additional spot compression tomosynthesis and full field
mL views were performed for the questioned asymmetry in the outer
right breast. On the additional imaging there is persistence of
asymmetric tissue without a definite mass or distortion.

Ultrasound:

Targeted ultrasound is performed throughout the outer right breast
from [DATE] to [DATE] demonstrating no suspicious cystic or solid mass
to correspond to the mammographic finding.
IMPRESSION: Right breast asymmetry without sonographic correlate is probably
benign.

RECOMMENDATION:
Diagnostic right breast mammogram in 6 months.

I have discussed the findings and recommendations with the patient.
If applicable, a reminder letter will be sent to the patient
regarding the next appointment.

BI-RADS CATEGORY  3: Probably benign.

## 2021-01-22 ENCOUNTER — Ambulatory Visit: Payer: No Typology Code available for payment source

## 2021-02-14 ENCOUNTER — Ambulatory Visit: Payer: No Typology Code available for payment source

## 2021-03-18 ENCOUNTER — Other Ambulatory Visit: Payer: Self-pay | Admitting: Obstetrics & Gynecology

## 2021-03-18 DIAGNOSIS — Z1231 Encounter for screening mammogram for malignant neoplasm of breast: Secondary | ICD-10-CM

## 2021-03-19 ENCOUNTER — Ambulatory Visit: Payer: Self-pay | Admitting: *Deleted

## 2021-03-19 ENCOUNTER — Encounter (INDEPENDENT_AMBULATORY_CARE_PROVIDER_SITE_OTHER): Payer: Self-pay

## 2021-03-19 ENCOUNTER — Other Ambulatory Visit: Payer: Self-pay

## 2021-03-19 ENCOUNTER — Ambulatory Visit
Admission: RE | Admit: 2021-03-19 | Discharge: 2021-03-19 | Disposition: A | Discharge status: Home / Self Care | Payer: No Typology Code available for payment source | Source: Ambulatory Visit | Attending: Obstetrics & Gynecology | Admitting: Obstetrics & Gynecology

## 2021-03-19 VITALS — BP 130/90 | Wt 167.6 lb

## 2021-03-19 DIAGNOSIS — Z1239 Encounter for other screening for malignant neoplasm of breast: Secondary | ICD-10-CM

## 2021-03-19 DIAGNOSIS — Z1231 Encounter for screening mammogram for malignant neoplasm of breast: Secondary | ICD-10-CM

## 2021-03-19 NOTE — Progress Notes (Signed)
Nicole Velasquez is a 45 y.o. female who presents to [facility] clinic today with no complaints.    Pap Smear: Pap smear not completed today. Last Pap smear was 05/17/2019 at the [facility] clinic and was normal. Per patient has a history of an abnormal Pap smear around 22 years ago that required a colposcopy for follow-up. Per patient all of her Pap smears have been normal since colposcopy and has at least 3 normal Pap smears completed. Last Pap smear result is available in Epic.  MS DIGITAL SCREENING BILATERAL  Result Date: 03/02/2017 CLINICAL DATA:  Screening. EXAM: DIGITAL SCREENING BILATERAL MAMMOGRAM WITH CAD COMPARISON:  Previous exam(s). ACR Breast Density Category c: The breast tissue is heterogeneously dense, which may obscure small masses. FINDINGS: There are no findings suspicious for malignancy. Images were processed with CAD. IMPRESSION: No mammographic evidence of malignancy. A result letter of this screening mammogram will be mailed directly to the patient. RECOMMENDATION: Screening mammogram in one year. (Code:SM-B-01Y) BI-RADS CATEGORY  1: Negative. Electronically Signed   By: Manfred Arch M.D.   On: 03/02/2017 13:48   MS DIGITAL SCREENING TOMO BILATERAL  Result Date: 05/25/2019 CLINICAL DATA:  Screening. EXAM: DIGITAL SCREENING BILATERAL MAMMOGRAM WITH TOMO AND CAD COMPARISON:  Previous exam(s). ACR Breast Density Category c: The breast tissue is heterogeneously dense, which may obscure small masses. FINDINGS: In the right breast, a possible asymmetry warrants further evaluation. In the left breast, no findings suspicious for malignancy. Images were processed with CAD. IMPRESSION: Further evaluation is suggested for possible asymmetry in the right breast. RECOMMENDATION: Diagnostic mammogram and possibly ultrasound of the right breast. (Code:FI-R-37M) The patient will be contacted regarding the findings, and additional imaging will be scheduled. BI-RADS  CATEGORY  0: Incomplete. Need additional imaging evaluation and/or prior mammograms for comparison. Electronically Signed   By: Annia Belt M.D.   On: 05/25/2019 08:33   MS DIGITAL SCREENING TOMO BILATERAL  Result Date: 03/04/2018 CLINICAL DATA:  Screening. EXAM: DIGITAL SCREENING BILATERAL MAMMOGRAM WITH TOMO AND CAD COMPARISON:  Previous exam(s). ACR Breast Density Category c: The breast tissue is heterogeneously dense, which may obscure small masses. FINDINGS: There are no findings suspicious for malignancy. Images were processed with CAD. IMPRESSION: No mammographic evidence of malignancy. A result letter of this screening mammogram will be mailed directly to the patient. RECOMMENDATION: Screening mammogram in one year. (Code:SM-B-01Y) BI-RADS CATEGORY  1: Negative. Electronically Signed   By: Edwin Cap M.D.   On: 03/04/2018 16:16   MS DIGITAL DIAG TOMO UNI RIGHT  Result Date: 11/29/2019 CLINICAL DATA:  Follow-up probably benign asymmetry in the outer right breast only seen in the craniocaudal projection with no ultrasound correlate. EXAM: DIGITAL DIAGNOSTIC UNILATERAL RIGHT MAMMOGRAM WITH CAD AND TOMO COMPARISON:  Previous exam(s). ACR Breast Density Category c: The breast tissue is heterogeneously dense, which may obscure small masses. FINDINGS: The previously demonstrated asymmetry in the outer right breast is less prominent today. This currently has the appearance of normal fibroglandular tissue with interspersed fat, similar to fibroglandular tissue elsewhere in breast. No findings suspicious for malignancy. Mammographic images were processed with CAD. IMPRESSION: No evidence of malignancy. The previously suspected right breast asymmetry was normal fibroglandular tissue. RECOMMENDATION: Bilateral screening mammogram in 6 months. That will be 1 year since mammographic evaluation of the left breast. I have discussed the findings and recommendations with the patient. If applicable, a reminder  letter will be sent to the patient regarding the next appointment. BI-RADS CATEGORY  1: Negative. Electronically Signed   By: Beckie Salts M.D.   On: 11/29/2019 10:14   MS DIGITAL DIAG TOMO UNI RIGHT  Result Date: 05/30/2019 CLINICAL DATA:  Patient presents today recall from screening for a possible right breast asymmetry. EXAM: DIGITAL DIAGNOSTIC RIGHT MAMMOGRAM WITH TOMO ULTRASOUND RIGHT BREAST COMPARISON:  Previous exam(s). ACR Breast Density Category c: The breast tissue is heterogeneously dense, which may obscure small masses. FINDINGS: Mammogram: Additional spot compression tomosynthesis and full field mL views were performed for the questioned asymmetry in the outer right breast. On the additional imaging there is persistence of asymmetric tissue without a definite mass or distortion. Ultrasound: Targeted ultrasound is performed throughout the outer right breast from 8:00 to 11:00 demonstrating no suspicious cystic or solid mass to correspond to the mammographic finding. IMPRESSION: Right breast asymmetry without sonographic correlate is probably benign. RECOMMENDATION: Diagnostic right breast mammogram in 6 months. I have discussed the findings and recommendations with the patient. If applicable, a reminder letter will be sent to the patient regarding the next appointment. BI-RADS CATEGORY  3: Probably benign. Electronically Signed   By: Emmaline Kluver M.D.   On: 05/30/2019 15:11     Physical exam: Breasts Breasts symmetrical. No skin abnormalities bilateral breasts. No nipple retraction bilateral breasts. No nipple discharge bilateral breasts. No lymphadenopathy. No lumps palpated bilateral breasts. No complaints of pain or tenderness on exam.      Pelvic/Bimanual Pap is not indicated today per BCCCP guidelines.   Smoking History: Patient has never smoked.   Patient Navigation: Patient education provided. Access to services provided for patient through [facility] program. Spanish interpreter  [name] from [facility] provided. Patient has food insecurities. Patient escorted to the food market at the [facility] for [employer] for groceries.  Breast and Cervical Cancer Risk Assessment: Patient does not have family history of breast cancer, known genetic mutations, or radiation treatment to the chest before age 48. Per patient has a history of cervical dysplasia. Patient has no history of being immunocompromised or DES exposure in-utero.  Risk Assessment     Risk Scores       03/19/2021 05/24/2019   Last edited by: Meryl Dare, CMA Kaidan Harpster, Carlye Grippe, RN   5-year risk: 0.3 % 0.3 %   Lifetime risk: 4.6 % 4.6 %            A: BCCCP exam without pap smear No complaints.  P: Referred patient to the [facility] for a screening mammogram on mobile unit. Appointment scheduled Tuesday, March 19, 2021 at 0920.  Priscille Heidelberg, RN 03/19/2021 8:38 AM

## 2021-03-19 NOTE — Patient Instructions (Signed)
Explained breast self awareness with Nicole Velasquez. Patient did not need a Pap smear today due to last Pap smear was in 05/17/2019. Let her know BCCCP will cover Pap smears every 3 years unless has a history of abnormal Pap smears. Referred patient to the [facility] for a screening mammogram on mobile unit. Appointment scheduled Tuesday, March 19, 2021 at 0920. Patient escorted to the mobile unit following BCCCP appointment for her screening mammogram. Let patient know the Breast Center will follow up with her within the next couple weeks with results of her mammogram by letter or phone. Nicole Velasquez verbalized understanding.  Audrea Bolte, Kathaleen Maser, RN 8:38 AM

## 2021-03-26 ENCOUNTER — Other Ambulatory Visit: Payer: Self-pay | Admitting: Obstetrics & Gynecology

## 2021-03-26 DIAGNOSIS — R928 Other abnormal and inconclusive findings on diagnostic imaging of breast: Secondary | ICD-10-CM

## 2021-03-27 ENCOUNTER — Other Ambulatory Visit: Payer: Self-pay | Admitting: Obstetrics & Gynecology

## 2021-03-27 DIAGNOSIS — R928 Other abnormal and inconclusive findings on diagnostic imaging of breast: Secondary | ICD-10-CM

## 2021-04-01 ENCOUNTER — Ambulatory Visit: Payer: No Typology Code available for payment source

## 2021-04-01 ENCOUNTER — Ambulatory Visit
Admission: RE | Admit: 2021-04-01 | Discharge: 2021-04-01 | Disposition: A | Discharge status: Home / Self Care | Payer: No Typology Code available for payment source | Source: Ambulatory Visit | Attending: Obstetrics & Gynecology | Admitting: Obstetrics & Gynecology

## 2021-04-01 ENCOUNTER — Other Ambulatory Visit: Payer: Self-pay

## 2021-04-01 DIAGNOSIS — R928 Other abnormal and inconclusive findings on diagnostic imaging of breast: Secondary | ICD-10-CM

## 2021-04-12 ENCOUNTER — Other Ambulatory Visit: Payer: No Typology Code available for payment source

## 2021-04-17 ENCOUNTER — Other Ambulatory Visit: Payer: Self-pay

## 2021-04-17 ENCOUNTER — Inpatient Hospital Stay: Payer: Self-pay | Attending: Obstetrics and Gynecology | Admitting: *Deleted

## 2021-04-17 VITALS — BP 118/88 | Ht 63.0 in | Wt 168.0 lb

## 2021-04-17 DIAGNOSIS — Z0000 Encounter for general adult medical examination without abnormal findings: Secondary | ICD-10-CM

## 2021-04-17 NOTE — Progress Notes (Addendum)
[facility] initial screening   Interpreter- [name], Haroldine Laws   Clinical Measurement:  Vitals:   04/17/21 0844  BP: 118/86   Fasting Labs Drawn Today, will review with patient when they result.   Medical History:  Patient states that she  does not know if she has  high cholesterol,  does not know if she has  high blood pressure and she does not have diabetes.  Medications:  Patient states that she does not take medication to lower cholesterol, blood pressure or blood sugar.  Patient does not take an aspirin a day to help prevent a heart attack or stroke.    Blood pressure, self measurement: Patient states that she does not measure blood pressure from home. She checks her blood pressure N/A. She shares her readings with a health care provider: N/A.   Nutrition: Patient states that on average she eats 0 cups of fruit and 0 cups of vegetables per day. Patient states that she does not eat fish at least 2 times per week. Patient eats about half servings of whole grains. Patient drinks less than 36 ounces of beverages with added sugar weekly: yes. Patient is currently watching sodium or salt intake: yes. In the past 7 days patient has consumed drinks containing alcohol on 1 days. On a day that patient consumes drinks containing alcohol on average 3 drinks are consumed.      Physical activity:  Patient states that she gets 60 minutes of moderate and 0 minutes of vigorous physical activity each week.  Smoking status:  Patient states that she has has never smoked .   Quality of life:  Over the past 2 weeks patient states that she had little interest or pleasure in doing things: nearly everyday. She has been feeling down, depressed or hopeless:nearly everyday.    Risk reduction and counseling:   Health Coaching: Spoke with patient about the daily recommendation for fruits and vegetables. Encouraged patient to try and add in at least 1 serving of fruits and vegetables in daily diet. Patient does  not consume fish often (monthly). Encouraged patient to try and add heart healthy fish into weekly diet. Gave suggestions for salmon, tuna, mackerel, sardines, sea bass or trout. Patient consumes a good amount of whole grains. Encouraged patient to continue with whole grain intake. Patient has been doing some low-intensity exercising from home 2 days a week. Encouraged patient to try and get at least 20 minutes of exercise daily.    Navigation:  I will notify patient of lab results.  Patient is aware of 2 more health coaching sessions and a follow up. Gave patient Pharmacist, hospital for counseling services offered in [place]. Took patient to the food market for a shopping experience.   Time: 25 minutes

## 2021-04-18 LAB — HEMOGLOBIN A1C
Est. average glucose Bld gHb Est-mCnc: 117 mg/dL
Hgb A1c MFr Bld: 5.7 % — ABNORMAL HIGH (ref 4.8–5.6)

## 2021-04-18 LAB — GLUCOSE, RANDOM: Glucose: 98 mg/dL (ref 70–99)

## 2021-04-18 LAB — LIPID PANEL
Chol/HDL Ratio: 10.3 ratio — ABNORMAL HIGH (ref 0.0–4.4)
Cholesterol, Total: 268 mg/dL — ABNORMAL HIGH (ref 100–199)
HDL: 26 mg/dL — ABNORMAL LOW (ref >39)
LDL Chol Calc (NIH): 107 mg/dL — ABNORMAL HIGH (ref 0–99)
Triglycerides: 763 mg/dL (ref 0–149)
VLDL Cholesterol Cal: 135 mg/dL — ABNORMAL HIGH (ref 5–40)

## 2021-04-22 ENCOUNTER — Telehealth: Payer: Self-pay

## 2021-04-22 NOTE — Telephone Encounter (Signed)
Left message for patient via Erika McReynolds, [employer] about lab results from [facility]. Left name and number for patient to call back.   

## 2021-04-22 NOTE — Telephone Encounter (Signed)
Health coaching 2   interpreter-   Labs-cholesterol , LDL cholesterol , triglycerides , HDL cholesterol , hemoglobin A1C , mean plasma glucose   Patient understands and is aware of her lab results.   Goals-  Spoke with patient in detail about lab results. Stressed to patient that labs were elevated in particular triglycerides level. Explained to patient that triglycerides were very high and have continued to go up over the past several years since starting [facility]. Stressed the importance of following up for these results and taking any medications that are prescribed to patient. Gave patient the following recommendations:  Reduce alcohol intake. Patient drinks several days a week and has several drinks on the days that she drinks. Patient admitted to being depressed recently and has contributed to her drinking.  Reduce the amount of sugars consumed in both food and drinks. Reduce the amount of carbs consumed. Gave recommendations on how much carbs soul be consumed daily. Start exercising for at least 30 minutes a day. Increase fiber intake. Gave patient recommendations for fiber rich foods that she can add into diet. Decrease the amount of fried and fatty foods consumed.    Navigation:  Patient is aware of 1 more health coaching sessions and a follow up. Patient is scheduled for follow-up with Internal Medicine on, April 29, 2021 @ 9:15 am.  Time-  20 minutes

## 2021-04-29 ENCOUNTER — Ambulatory Visit (INDEPENDENT_AMBULATORY_CARE_PROVIDER_SITE_OTHER): Payer: Self-pay | Admitting: Student

## 2021-04-29 DIAGNOSIS — E8881 Metabolic syndrome: Secondary | ICD-10-CM

## 2021-04-29 DIAGNOSIS — R7303 Prediabetes: Secondary | ICD-10-CM

## 2021-04-29 DIAGNOSIS — E785 Hyperlipidemia, unspecified: Secondary | ICD-10-CM

## 2021-04-29 MED ORDER — ATORVASTATIN CALCIUM 40 MG PO TABS: 40.0000 mg | ORAL_TABLET | Freq: Every day | ORAL | 1 refills | Status: DC

## 2021-04-29 NOTE — Patient Instructions (Signed)
Norma Fredrickson,  Fue un placer verte en la clnica hoy.   1. Tus azcares estaban elevados. Por favor, haga ejercicio al menos 150 minutos cada semana y trate de seguir la dieta proporcionada. 2. Sus niveles de colesterol estaban elevados. He recetado un medicamento para el colesterol para ayudar a Special educational needs teacher. Por favor, tmelo una vez al da.  Llame a nuestra clnica al [phone] si tiene alguna pregunta o inquietud. El mejor momento para llamar es de lunes a viernes de 9 a.m. a 4 p.m., pero hay alguien [employer], los 7 [address]  [address] de la semana, al mismo nmero. Si necesita resurtir [employer], notifique a su farmacia con una semana de anticipacin y nos enviarn una solicitud.   Gracias por dejarnos participar en su cuidado. Esperamos verte la prxima vez!

## 2021-04-29 NOTE — Assessment & Plan Note (Signed)
Patient presents for elevated triglycerides and LDL on labs. On chart review, patient has been seen several times for elevated lipids and was noted to have fatty liver infiltration on CT in 2018.   Labs showing triglycerides 763, HDL 26, LDL 107. ASCVD risk at this time is 4.3%. She did meet criteria for metabolic syndrome in the past and was started on lipitor, but she reports that she has not taken it for a while. Discussed benefits of lifestyle modifications (diet and exercise) along with restarting lipitor. She confirms understanding and is agreeable with plan.  Plan: -pamphlet provided on diet and exercise  -restarting lipitor 40mg  daily -f/u in 6 weeks for repeat lipid profile

## 2021-04-29 NOTE — Progress Notes (Signed)
   CC: elevated cholesterol and A1c  HPI:  [name] is a 45 y.o. female with history listed below presenting to the [facility] from [facility] for elevated A1c and cholesterol. Please see individualized problem based charting for full HPI.  Past Medical History:  Diagnosis Date   Hyperlipemia     Review of Systems:  Negative aside from that listed in individualized problem based charting.  Physical Exam:  Vitals:   04/29/21 0932  BP: (!) 128/96  Pulse: 82  Temp: 98.6 F (37 C)  TempSrc: Oral  SpO2: 100%  Weight: 170 lb 12.8 oz (77.5 kg)   Physical Exam Constitutional:      Appearance: She is obese. She is not ill-appearing.  HENT:     Mouth/Throat:     Mouth: Mucous membranes are moist.     Pharynx: Oropharynx is clear. No oropharyngeal exudate.  Eyes:     Extraocular Movements: Extraocular movements intact.     Conjunctiva/sclera: Conjunctivae normal.     Pupils: Pupils are equal, round, and reactive to light.  Cardiovascular:     Rate and Rhythm: Normal rate and regular rhythm.     Pulses: Normal pulses.     Heart sounds: Normal heart sounds. No murmur heard.   No friction rub. No gallop.  Pulmonary:     Effort: Pulmonary effort is normal.     Breath sounds: Normal breath sounds. No wheezing, rhonchi or rales.  Abdominal:     General: Bowel sounds are normal. There is no distension.     Palpations: Abdomen is soft.     Tenderness: There is no abdominal tenderness.  Musculoskeletal:        General: No swelling. Normal range of motion.  Skin:    General: Skin is warm and dry.  Neurological:     General: No focal deficit present.     Mental Status: She is alert and oriented to person, place, and time.  Psychiatric:        Mood and Affect: Mood normal.        Behavior: Behavior normal.     Assessment & Plan:   See Encounters Tab for problem based charting.  Patient discussed with Dr. Antony Contras

## 2021-04-29 NOTE — Assessment & Plan Note (Signed)
Presents with HgbA1c 5.7%. Discussed that she is in prediabetic range and would greatly benefit from lifestyle modifications. Given pamphlet and diabetic diet to help provide better glycemic control and prevent progression to diabetes.  Patient would benefit from GLP-1 agonist therapy for glycemic control and for weight loss (given BMI 30.26). However, she has a history of pancreatitis and thus will hold off on starting GLP-1 agonist.  Plan: -lifestyle modifications (diet and exercise)

## 2021-04-29 NOTE — Assessment & Plan Note (Signed)
Patient meets criteria for metabolic syndrome given obesity, triglycerides >150, HDL <50, and mild HTN (diastolic >85).   I have counseled her extensively on benefits of weight loss via diet and exercise (pamphlets provided). Unfortunately, she is not a candidate for GLP-1 agonist therapy given prior history of pancreatitis and she is uninsured which limits other weight loss medications at this time.

## 2021-05-02 NOTE — Progress Notes (Signed)
Internal Medicine Clinic Attending  Case discussed with Dr. Jinwala  At the time of the visit.  We reviewed the resident's history and exam and pertinent patient test results.  I agree with the assessment, diagnosis, and plan of care documented in the resident's note.  

## 2021-06-10 ENCOUNTER — Encounter: Payer: Self-pay | Admitting: Internal Medicine

## 2021-06-10 ENCOUNTER — Encounter: Payer: No Typology Code available for payment source | Admitting: Internal Medicine

## 2021-06-10 ENCOUNTER — Telehealth: Payer: Self-pay | Admitting: *Deleted

## 2021-06-10 NOTE — Telephone Encounter (Signed)
Pt did not come her appt this am. Called pt -  stated she forgot her appt. Appt re-schedule next week 11/29 @ 0945 AM.

## 2021-06-18 ENCOUNTER — Ambulatory Visit (INDEPENDENT_AMBULATORY_CARE_PROVIDER_SITE_OTHER): Payer: 59 | Admitting: Internal Medicine

## 2021-06-18 ENCOUNTER — Other Ambulatory Visit: Payer: Self-pay

## 2021-06-18 ENCOUNTER — Encounter: Payer: Self-pay | Admitting: Internal Medicine

## 2021-06-18 VITALS — BP 116/75 | HR 77 | Temp 98.1°F | Ht 64.0 in | Wt 163.0 lb

## 2021-06-18 DIAGNOSIS — E782 Mixed hyperlipidemia: Secondary | ICD-10-CM

## 2021-06-18 DIAGNOSIS — E8881 Metabolic syndrome: Secondary | ICD-10-CM | POA: Diagnosis not present

## 2021-06-18 DIAGNOSIS — E785 Hyperlipidemia, unspecified: Secondary | ICD-10-CM

## 2021-06-18 NOTE — Assessment & Plan Note (Addendum)
Ms. Lochner states that she has been tolerating atorvastatin daily without any difficulty since restarting it.  She is here to have her lipid panel rechecked.  She notes that she has 2 sisters who also have hypertriglyceridemia in addition to her daughter but notes that her daughter is overweight and this is why it was found.    Assessment/plan: - Repeat lipid panel today to assess response to high intensity statin - If remains above goal, consider addition fenofibrate - Question the possibility of this being a familial hypertriglyceridemia.  We discussed potential genetic testing but will defer at this time.  ADDENDUM:  LDL improved to within goal of <100, however TG are still quite elevated in the moderate-severe range. ASCVD risk score of 1.9%. Given risk of pancreatitis and DILI with fibrate therapy though, will start treating with marina omega-3 fatty acid.   - Lovaza 2 g BID sent to pharmacy. Follow up in 4-6 weeks for repeat blood work.   ADDENDUM:  Vascepa preferred by insurance.   - Vascepa 2 g BID sent to pharmacy. Discontinue Lovaza.

## 2021-06-18 NOTE — Patient Instructions (Signed)
It was nice seeing you today! Thank you for choosing [facility] for your Primary Care.    Today we talked about:   Your blood pressure today was much better!   Cholesterol: We will recheck your cholesterol today and I will call you with the results. Follow up will be based on those results.  ---------------------------------- Bennett Scrape verte hoy! Karl Pock por elegir [facility] para su [facility].  Hoy hablamos de:  1. Tu presin arterial hoy fue mucho mejor!  2. Colesterol: Volveremos a Sales promotion account executive y Engineer, structural para informarle los [place]. El seguimiento se basar en esos resultados.

## 2021-06-18 NOTE — Assessment & Plan Note (Signed)
BP: 116/75   Blood pressure significantly improved today.  Patient has lost approximately 7 pounds since her last visit.  She states that this is due to increased physical requirements at work.  She has been working on eating healthier as well.  - Continue working on risk management and lifestyle adjustments

## 2021-06-18 NOTE — Progress Notes (Signed)
   CC: Hyperlipidemia   HPI:  [name] is a 45 y.o. with a PMHx as listed below who presents to the clinic for Hyperlipidemia .   Please see the Encounters tab for problem-based Assessment & Plan regarding status of patient's acute and chronic conditions.  Past Medical History:  Diagnosis Date   Acute pancreatitis 11/27/2019   Hyperlipemia    Review of Systems: Review of Systems  Constitutional:  Positive for weight loss (increased physical requirements at work). Negative for chills and fever.  Gastrointestinal:  Negative for abdominal pain, constipation, diarrhea, nausea and vomiting.  Musculoskeletal:  Negative for myalgias.  Neurological:  Negative for dizziness and headaches.   Physical Exam:  Vitals:   06/18/21 0945  BP: 116/75  Pulse: 77  Temp: 98.1 F (36.7 C)  TempSrc: Oral  SpO2: 100%  Weight: 163 lb (73.9 kg)  Height: 5\' 4"  (1.626 m)   Physical Exam Vitals and nursing note reviewed.  Constitutional:      Appearance: She is normal weight.  HENT:     Head: Normocephalic and atraumatic.  Eyes:     Extraocular Movements: Extraocular movements intact.     Pupils: Pupils are equal, round, and reactive to light.  Pulmonary:     Effort: Pulmonary effort is normal. No respiratory distress.  Skin:    General: Skin is warm and dry.  Neurological:     General: No focal deficit present.     Mental Status: She is alert and oriented to person, place, and time. Mental status is at baseline.  Psychiatric:        Mood and Affect: Mood normal.        Behavior: Behavior normal.        Thought Content: Thought content normal.        Judgment: Judgment normal.    Assessment & Plan:   See Encounters Tab for problem based charting.  Patient discussed with Dr. 

## 2021-06-19 LAB — LIPID PANEL
Chol/HDL Ratio: 7.1 ratio — ABNORMAL HIGH (ref 0.0–4.4)
Cholesterol, Total: 206 mg/dL — ABNORMAL HIGH (ref 100–199)
HDL: 29 mg/dL — ABNORMAL LOW (ref >39)
LDL Chol Calc (NIH): 84 mg/dL (ref 0–99)
Triglycerides: 575 mg/dL (ref 0–149)
VLDL Cholesterol Cal: 93 mg/dL — ABNORMAL HIGH (ref 5–40)

## 2021-06-19 MED ORDER — OMEGA-3-ACID ETHYL ESTERS 1 G PO CAPS: 2.0000 g | ORAL_CAPSULE | Freq: Two times a day (BID) | ORAL | 2 refills | Status: DC

## 2021-06-19 NOTE — Addendum Note (Signed)
Addended by: Verdene Lennert on: 06/19/2021 04:26 PM   Modules accepted: Orders

## 2021-06-24 ENCOUNTER — Telehealth: Payer: Self-pay | Admitting: *Deleted

## 2021-06-24 MED ORDER — ICOSAPENT ETHYL 1 G PO CAPS: 2.0000 g | ORAL_CAPSULE | Freq: Two times a day (BID) | ORAL | 1 refills | Status: AC

## 2021-06-24 NOTE — Telephone Encounter (Signed)
Call from patient staed that Lovaza not covered under her insurance.  Cost to her would be $100.00. Unable to afford.  Call to patient's insurance company medication is not on their formulary.  Vascepa is but will require a PA.  Message to be sent to patient's PCP to see if change is appropriate.

## 2021-06-24 NOTE — Addendum Note (Signed)
Addended by: Verdene Lennert on: 06/24/2021 02:57 PM   Modules accepted: Orders

## 2021-07-02 NOTE — Progress Notes (Signed)
Internal Medicine Clinic Attending  Case discussed with Dr. Basaraba  At the time of the visit.  We reviewed the resident's history and exam and pertinent patient test results.  I agree with the assessment, diagnosis, and plan of care documented in the resident's note.  

## 2021-10-30 ENCOUNTER — Telehealth: Payer: Self-pay

## 2021-10-30 NOTE — Telephone Encounter (Signed)
Left message for patient via Erika McReynolds, [employer] about completing HC 3 for the [facility] program. Left name and number for patient to call back.  °

## 2021-12-14 ENCOUNTER — Other Ambulatory Visit: Payer: Self-pay | Admitting: Student

## 2021-12-14 DIAGNOSIS — E785 Hyperlipidemia, unspecified: Secondary | ICD-10-CM

## 2022-08-23 ENCOUNTER — Inpatient Hospital Stay (HOSPITAL_COMMUNITY)
Admission: EM | Admit: 2022-08-23 | Discharge: 2022-08-25 | DRG: 439 | Disposition: A | Discharge status: Home / Self Care | Payer: BLUE CROSS/BLUE SHIELD | Attending: Student in an Organized Health Care Education/Training Program | Admitting: Student in an Organized Health Care Education/Training Program

## 2022-08-23 ENCOUNTER — Encounter (HOSPITAL_COMMUNITY): Payer: Self-pay | Admitting: Emergency Medicine

## 2022-08-23 ENCOUNTER — Other Ambulatory Visit: Payer: Self-pay

## 2022-08-23 ENCOUNTER — Emergency Department (HOSPITAL_COMMUNITY): Payer: BLUE CROSS/BLUE SHIELD

## 2022-08-23 DIAGNOSIS — E8881 Metabolic syndrome: Secondary | ICD-10-CM | POA: Diagnosis present

## 2022-08-23 DIAGNOSIS — E7800 Pure hypercholesterolemia, unspecified: Secondary | ICD-10-CM | POA: Diagnosis present

## 2022-08-23 DIAGNOSIS — Z560 Unemployment, unspecified: Secondary | ICD-10-CM | POA: Diagnosis not present

## 2022-08-23 DIAGNOSIS — F1010 Alcohol abuse, uncomplicated: Secondary | ICD-10-CM | POA: Diagnosis present

## 2022-08-23 DIAGNOSIS — E8729 Other acidosis: Secondary | ICD-10-CM | POA: Diagnosis present

## 2022-08-23 DIAGNOSIS — K760 Fatty (change of) liver, not elsewhere classified: Secondary | ICD-10-CM | POA: Diagnosis present

## 2022-08-23 DIAGNOSIS — E871 Hypo-osmolality and hyponatremia: Secondary | ICD-10-CM | POA: Diagnosis present

## 2022-08-23 DIAGNOSIS — K852 Alcohol induced acute pancreatitis without necrosis or infection: Principal | ICD-10-CM | POA: Diagnosis present

## 2022-08-23 DIAGNOSIS — F1090 Alcohol use, unspecified, uncomplicated: Secondary | ICD-10-CM | POA: Diagnosis present

## 2022-08-23 DIAGNOSIS — E781 Pure hyperglyceridemia: Secondary | ICD-10-CM | POA: Diagnosis present

## 2022-08-23 DIAGNOSIS — R7401 Elevation of levels of liver transaminase levels: Secondary | ICD-10-CM | POA: Diagnosis present

## 2022-08-23 LAB — URINALYSIS, ROUTINE W REFLEX MICROSCOPIC
Bilirubin Urine: NEGATIVE
Glucose, UA: NEGATIVE mg/dL
Ketones, ur: NEGATIVE mg/dL
Leukocytes,Ua: NEGATIVE
Nitrite: NEGATIVE
Protein, ur: 30 mg/dL — AB
Specific Gravity, Urine: 1.02 (ref 1.005–1.030)
pH: 5 (ref 5.0–8.0)

## 2022-08-23 LAB — LIPID PANEL
Cholesterol: 249 mg/dL — ABNORMAL HIGH (ref 0–200)
HDL: 24 mg/dL — ABNORMAL LOW (ref >40)
LDL Cholesterol: UNDETERMINED mg/dL (ref 0–99)
Total CHOL/HDL Ratio: 10.4 RATIO
Triglycerides: 581 mg/dL — ABNORMAL HIGH (ref <150)
VLDL: UNDETERMINED mg/dL (ref 0–40)

## 2022-08-23 LAB — CBC
HCT: 38.3 % (ref 36.0–46.0)
Hemoglobin: 12.5 g/dL (ref 12.0–15.0)
MCH: 30.3 pg (ref 26.0–34.0)
MCHC: 32.6 g/dL (ref 30.0–36.0)
MCV: 92.7 fL (ref 80.0–100.0)
Platelets: 259 10*3/uL (ref 150–400)
RBC: 4.13 MIL/uL (ref 3.87–5.11)
RDW: 11.9 % (ref 11.5–15.5)
WBC: 14 10*3/uL — ABNORMAL HIGH (ref 4.0–10.5)
nRBC: 0 % (ref 0.0–0.2)

## 2022-08-23 LAB — TROPONIN I (HIGH SENSITIVITY): Troponin I (High Sensitivity): 3 ng/L (ref <18)

## 2022-08-23 LAB — I-STAT BETA HCG BLOOD, ED (MC, WL, AP ONLY): I-stat hCG, quantitative: <5 m[IU]/mL (ref <5)

## 2022-08-23 LAB — COMPREHENSIVE METABOLIC PANEL
ALT: 66 U/L — ABNORMAL HIGH (ref 0–44)
AST: 91 U/L — ABNORMAL HIGH (ref 15–41)
Albumin: 3.9 g/dL (ref 3.5–5.0)
Alkaline Phosphatase: 72 U/L (ref 38–126)
Anion gap: 17 — ABNORMAL HIGH (ref 5–15)
BUN: <5 mg/dL — ABNORMAL LOW (ref 6–20)
CO2: 19 mmol/L — ABNORMAL LOW (ref 22–32)
Calcium: 9.3 mg/dL (ref 8.9–10.3)
Chloride: 98 mmol/L (ref 98–111)
Creatinine, Ser: 0.81 mg/dL (ref 0.44–1.00)
GFR, Estimated: >60 mL/min (ref >60)
Glucose, Bld: 114 mg/dL — ABNORMAL HIGH (ref 70–99)
Potassium: 3.8 mmol/L (ref 3.5–5.1)
Sodium: 134 mmol/L — ABNORMAL LOW (ref 135–145)
Total Bilirubin: 0.6 mg/dL (ref 0.3–1.2)
Total Protein: 7.4 g/dL (ref 6.5–8.1)

## 2022-08-23 LAB — LDL CHOLESTEROL, DIRECT: Direct LDL: 149 mg/dL — ABNORMAL HIGH (ref 0–99)

## 2022-08-23 LAB — LIPASE, BLOOD: Lipase: 182 U/L — ABNORMAL HIGH (ref 11–51)

## 2022-08-23 MED ORDER — FOLIC ACID 1 MG PO TABS
1.0000 mg | ORAL_TABLET | Freq: Every day | ORAL | Status: DC
  Administered 2022-08-24 – 2022-08-25 (×2): 1 mg via ORAL
  Filled 2022-08-23 (×2): qty 1

## 2022-08-23 MED ORDER — THIAMINE HCL 100 MG/ML IJ SOLN: 100.0000 mg | Freq: Every day | INTRAMUSCULAR | Status: DC

## 2022-08-23 MED ORDER — THIAMINE MONONITRATE 100 MG PO TABS
100.0000 mg | ORAL_TABLET | Freq: Every day | ORAL | Status: DC
  Administered 2022-08-24 – 2022-08-25 (×2): 100 mg via ORAL
  Filled 2022-08-23 (×2): qty 1

## 2022-08-23 MED ORDER — ONDANSETRON HCL 4 MG/2ML IJ SOLN
4.0000 mg | Freq: Once | INTRAMUSCULAR | Status: AC
  Administered 2022-08-23: 4 mg via INTRAVENOUS
  Filled 2022-08-23: qty 2

## 2022-08-23 MED ORDER — SODIUM CHLORIDE 0.9 % IV SOLN: INTRAVENOUS | Status: AC

## 2022-08-23 MED ORDER — ADULT MULTIVITAMIN W/MINERALS CH
1.0000 | ORAL_TABLET | Freq: Every day | ORAL | Status: DC
  Administered 2022-08-24 – 2022-08-25 (×2): 1 via ORAL
  Filled 2022-08-23 (×2): qty 1

## 2022-08-23 MED ORDER — HYDROMORPHONE HCL 1 MG/ML IJ SOLN
0.5000 mg | Freq: Once | INTRAMUSCULAR | Status: AC
  Administered 2022-08-23: 0.5 mg via INTRAVENOUS
  Filled 2022-08-23: qty 1

## 2022-08-23 MED ORDER — ENOXAPARIN SODIUM 40 MG/0.4ML IJ SOSY
40.0000 mg | PREFILLED_SYRINGE | INTRAMUSCULAR | Status: DC
  Administered 2022-08-24 – 2022-08-25 (×2): 40 mg via SUBCUTANEOUS
  Filled 2022-08-23 (×2): qty 0.4

## 2022-08-23 MED ORDER — HYDROMORPHONE HCL 2 MG PO TABS
1.0000 mg | ORAL_TABLET | ORAL | Status: DC | PRN
  Administered 2022-08-23 – 2022-08-25 (×8): 1 mg via ORAL
  Filled 2022-08-23 (×9): qty 1

## 2022-08-23 MED ORDER — MORPHINE SULFATE (PF) 4 MG/ML IV SOLN
4.0000 mg | Freq: Once | INTRAVENOUS | Status: AC
  Administered 2022-08-23: 4 mg via INTRAVENOUS
  Filled 2022-08-23: qty 1

## 2022-08-23 MED ORDER — SODIUM CHLORIDE 0.9 % IV BOLUS
1000.0000 mL | Freq: Once | INTRAVENOUS | Status: AC
  Administered 2022-08-23: 1000 mL via INTRAVENOUS

## 2022-08-23 MED ORDER — IOHEXOL 350 MG/ML SOLN
75.0000 mL | Freq: Once | INTRAVENOUS | Status: AC | PRN
  Administered 2022-08-23: 75 mL via INTRAVENOUS

## 2022-08-23 MED ORDER — HYDROMORPHONE HCL 1 MG/ML IJ SOLN
1.0000 mg | Freq: Once | INTRAMUSCULAR | Status: AC
  Administered 2022-08-23: 1 mg via INTRAVENOUS
  Filled 2022-08-23: qty 1

## 2022-08-23 NOTE — ED Provider Triage Note (Signed)
Emergency Medicine Provider Triage Evaluation Note  Nicole Velasquez , a 47 y.o. female  was evaluated in triage.  Pt complains of upper abdominal pain that started early this morning.  No nausea, vomiting, or diarrhea.  Per chart review history of pancreatitis.  Admits to drinking alcohol.  Also endorses chest pain.  Review of Systems  Positive: Abd pain, CP Negative: fever  Physical Exam  BP (!) 136/91   Pulse (!) 101   Temp 98.1 F (36.7 C) (Oral)   Resp (!) 24   SpO2 99%  Gen:   Awake, no distress   Resp:  Normal effort  MSK:   Moves extremities without difficulty  Other:  TTP in epigastric region  Medical Decision Making  Medically screening exam initiated at 3:35 PM.  Appropriate orders placed.  [place] was informed that the remainder of the evaluation will be completed by another provider, this initial triage assessment does not replace that evaluation, and the importance of remaining in the ED until their evaluation is complete.  Labs CT abdomen EKG, troponin   Suzy Bouchard, [place] 08/23/22 1539

## 2022-08-23 NOTE — ED Notes (Signed)
ED TO INPATIENT HANDOFF REPORT  ED Nurse Name and Phone #: Jae Dire Name/Age/Gender Nicole Velasquez 47 y.o. female Room/Bed: 020C/020C  Code Status   Code Status: Full Code  Home/SNF/Other Home Patient oriented to: self, place, time, and situation Is this baseline? 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 Pt denies N/V and fever.    Allergies No Known Allergies  Level of Care/Admitting Diagnosis ED Disposition     ED Disposition  Admit   Condition  --   Comment  Hospital Area: [facility] [[id number]]  Level of Care: Med-Surg [16]  May admit patient to [facility] or [facility] if equivalent level of care is available:: No  Covid Evaluation: Asymptomatic - no recent exposure (last 10 days) testing not required  Diagnosis: Alcoholic pancreatitis [297989]  Admitting Physician: Axel Filler [[id number]]  Attending Physician: Axel Filler [[id number]]  Certification:: I certify this patient will need inpatient services for at least 2 midnights  Estimated Length of Stay: 3          B Medical/Surgery History Past Medical History:  Diagnosis Date   Acute pancreatitis 11/27/2019   Hyperlipemia    Past Surgical History:  Procedure Laterality Date   LAPAROTOMY N/A 11/30/2016   Procedure: LAPAROTOMY;  Surgeon: Woodroe Mode, MD;  Location: [facility];  Service: Gynecology;  Laterality: N/A;     A IV Location/Drains/Wounds Patient Lines/Drains/Airways Status     Active Line/Drains/Airways     Name Placement date Placement time Site Days   Peripheral IV 08/23/22 20 G Right Antecubital 08/23/22  1648  Antecubital  less than 1   Incision (Closed) 11/30/16 Vagina 11/30/16  1505  -- 2092   Incision (Closed) 11/30/16 Abdomen 11/30/16  1505  -- 2092            Intake/Output Last 24  hours  Intake/Output Summary (Last 24 hours) at 08/23/2022 2053 Last data filed at 08/23/2022 1754 Gross per 24 hour  Intake 1000 ml  Output --  Net 1000 ml    Labs/Imaging Results for orders placed or performed during the hospital encounter of 08/23/22 (from the past 48 hour(s))  Lipase, blood     Status: Abnormal   Collection Time: 08/23/22  3:29 PM  Result Value Ref Range   Lipase 182 (H) 11 - 51 U/L    Comment: Performed at [facility], [place] [address], [place], [place] [address]  Comprehensive metabolic panel     Status: Abnormal   Collection Time: 08/23/22  3:29 PM  Result Value Ref Range   Sodium 134 (L) 135 - 145 mmol/L   Potassium 3.8 3.5 - 5.1 mmol/L   Chloride 98 98 - 111 mmol/L   CO2 19 (L) 22 - 32 mmol/L   Glucose, Bld 114 (H) 70 - 99 mg/dL    Comment: Glucose reference range applies only to samples taken after fasting for at least 8 hours.   BUN <5 (L) 6 - 20 mg/dL   Creatinine, Ser 0.81 0.44 - 1.00 mg/dL   Calcium 9.3 8.9 - 10.3 mg/dL   Total Protein 7.4 6.5 - 8.1 g/dL   Albumin 3.9 3.5 - 5.0 g/dL   AST 91 (H) 15 - 41 U/L   ALT 66 (H) 0 - 44 U/L   Alkaline Phosphatase 72 38 - 126 U/L   Total  Bilirubin 0.6 0.3 - 1.2 mg/dL   GFR, Estimated >63 >01 mL/min    Comment: (NOTE) Calculated using the CKD-EPI Creatinine Equation (2021)    Anion gap 17 (H) 5 - 15    Comment: Performed at [facility], [address] [place], [place] [address]  CBC     Status: Abnormal   Collection Time: 08/23/22  3:29 PM  Result Value Ref Range   WBC 14.0 (H) 4.0 - 10.5 K/uL   RBC 4.13 3.87 - 5.11 MIL/uL   Hemoglobin 12.5 12.0 - 15.0 g/dL   HCT 32.3 55.7 - 32.2 %   MCV 92.7 80.0 - 100.0 fL   MCH 30.3 26.0 - 34.0 pg   MCHC 32.6 30.0 - 36.0 g/dL   RDW 02.5 42.7 - 06.2 %   Platelets 259 150 - 400 K/uL   nRBC 0.0 0.0 - 0.2 %    Comment: Performed at [facility], [address], [place], [place] [address]  Urinalysis, Routine w reflex microscopic -Urine, Clean  Catch     Status: Abnormal   Collection Time: 08/23/22  3:29 PM  Result Value Ref Range   Color, Urine YELLOW YELLOW   APPearance HAZY (A) CLEAR   Specific Gravity, Urine 1.020 1.005 - 1.030   pH 5.0 5.0 - 8.0   Glucose, UA NEGATIVE NEGATIVE mg/dL   Hgb urine dipstick SMALL (A) NEGATIVE   Bilirubin Urine NEGATIVE NEGATIVE   Ketones, ur NEGATIVE NEGATIVE mg/dL   Protein, ur 30 (A) NEGATIVE mg/dL   Nitrite NEGATIVE NEGATIVE   Leukocytes,Ua NEGATIVE NEGATIVE   RBC / HPF 0-5 0 - 5 RBC/hpf   WBC, UA 0-5 0 - 5 WBC/hpf   Bacteria, UA RARE (A) NONE SEEN   Squamous Epithelial / HPF 0-5 0 - 5 /HPF   Mucus PRESENT    Hyaline Casts, UA PRESENT     Comment: Performed at [facility], [address], [place], [place] [address]  Troponin I (High Sensitivity)     Status: None   Collection Time: 08/23/22  3:29 PM  Result Value Ref Range   Troponin I (High Sensitivity) 3 <18 ng/L    Comment: (NOTE) Elevated high sensitivity troponin I (hsTnI) values and significant  changes across serial measurements may suggest ACS but many other  chronic and acute conditions are known to elevate hsTnI results.  Refer to the "Links" section for chest pain algorithms and additional  guidance. Performed at [facility], [address], [place], [place] [address]   I-Stat beta hCG blood, ED     Status: None   Collection Time: 08/23/22  3:54 PM  Result Value Ref Range   I-stat hCG, quantitative <5.0 <5 mIU/mL   Comment 3            Comment:   GEST. AGE      CONC.  (mIU/mL)   <=1 WEEK        5 - 50     2 WEEKS       50 - 500     3 WEEKS       100 - 10,000     4 WEEKS     1,000 - 30,000        FEMALE AND NON-PREGNANT FEMALE:     LESS THAN 5 mIU/mL    CT ABDOMEN PELVIS W CONTRAST  Result Date: 08/23/2022 CLINICAL DATA:  Mid abdominal pain.  Clinical pancreatitis. EXAM: CT ABDOMEN AND  PELVIS WITH CONTRAST TECHNIQUE: Multidetector CT imaging of the abdomen and pelvis was performed using the standard  protocol following bolus administration of intravenous contrast. RADIATION DOSE REDUCTION: This exam was performed according to the departmental dose-optimization program which includes automated exposure control, adjustment of the mA and/or kV according to patient size and/or use of iterative reconstruction technique. CONTRAST:  24mL OMNIPAQUE IOHEXOL 350 MG/ML SOLN COMPARISON:  11/27/2019 FINDINGS: Lower chest: Clear lung bases.  Borderline cardiomegaly. Hepatobiliary: Hepatic steatosis and hepatomegaly at 18.6 cm. Caudate and lateral segment left liver lobe enlargement with mildly irregular hepatic capsule. Normal gallbladder, without biliary ductal dilatation. Pancreas: Moderate peripancreatic edema adjacent the head, neck, and uncinate process, including on 26/3. No pancreatic necrosis, duct dilatation, or well-defined peripancreatic fluid collection. Spleen: Normal in size, without focal abnormality. Adrenals/Urinary Tract: Normal adrenal glands. Normal kidneys, without hydronephrosis. Normal urinary bladder. Stomach/Bowel: Normal stomach, without wall thickening. Normal colon, appendix, and terminal ileum. Normal small bowel. Vascular/Lymphatic: Normal caliber of the aorta and branch vessels. Retroaortic left renal vein. Patent portal and splenic veins. No abdominopelvic adenopathy. Reproductive: Normal uterus and adnexa. Other: No significant free fluid.  No free intraperitoneal air. Small fat containing paraumbilical hernia. Musculoskeletal: No acute osseous abnormality. IMPRESSION: 1. Mild to moderate noncomplicated pancreatitis. 2. Hepatic steatosis and hepatomegaly. Morphology for which mild cirrhosis is a concern. 3.  Aortic Atherosclerosis (ICD10-I70.0). Electronically Signed   By: Abigail Miyamoto M.D.   On: 08/23/2022 17:46   DG Chest 1 View  Result Date: 08/23/2022 CLINICAL DATA:  Chest pain. EXAM: CHEST  1 VIEW COMPARISON:  January 10, 2016 FINDINGS: The heart size and mediastinal contours are within  normal limits. Both lungs are clear. The visualized skeletal structures are unremarkable. IMPRESSION: No active disease. Electronically Signed   By: Fidela Salisbury M.D.   On: 08/23/2022 16:06    Pending Labs Unresulted Labs (From admission, onward)     Start     Ordered   08/24/22 0500  HIV Antibody (routine testing w rflx)  (HIV Antibody (Routine testing w reflex) panel)  Tomorrow morning,   R        08/23/22 1958   08/24/22 0500  Comprehensive metabolic panel  Tomorrow morning,   R        08/23/22 2029   08/24/22 0500  CBC  Tomorrow morning,   R        08/23/22 2029   08/23/22 2022  Lipid panel  Add-on,   AD        08/23/22 2021   Pending  Protime-INR  Once,   R        Pending            Vitals/Pain Today's Vitals   08/23/22 1845 08/23/22 1915 08/23/22 2045 08/23/22 2052  BP:  125/83 135/84   Pulse:  97 100   Resp:  15 16   Temp:   98.1 F (36.7 C)   TempSrc:   Oral   SpO2:  95% 97%   Weight:    72.6 kg  Height:    5\' 4"  (1.626 m)  PainSc: 6   6      Isolation Precautions No active isolations  Medications Medications  enoxaparin (LOVENOX) injection 40 mg (has no administration in time range)  0.9 %  sodium chloride infusion ( Intravenous New Bag/Given 08/23/22 2050)  HYDROmorphone (DILAUDID) tablet 1 mg (1 mg Oral Given 08/23/22 2046)  thiamine (VITAMIN B1) tablet 100 mg (has no administration in time range)  Or  thiamine (VITAMIN B1) injection 100 mg (has no administration in time range)  folic acid (FOLVITE) tablet 1 mg (has no administration in time range)  multivitamin with minerals tablet 1 tablet (has no administration in time range)  morphine (PF) 4 MG/ML injection 4 mg (4 mg Intravenous Given 08/23/22 1701)  sodium chloride 0.9 % bolus 1,000 mL (0 mLs Intravenous Stopped 08/23/22 1754)  ondansetron (ZOFRAN) injection 4 mg (4 mg Intravenous Given 08/23/22 1701)  iohexol (OMNIPAQUE) 350 MG/ML injection 75 mL (75 mLs Intravenous Contrast Given 08/23/22 1736)   HYDROmorphone (DILAUDID) injection 1 mg (1 mg Intravenous Given 08/23/22 1756)    Mobility walks     Focused Assessments     R Recommendations: See Admitting Provider Note  Report given to:   Additional Notes:

## 2022-08-23 NOTE — Hospital Course (Addendum)
Drinking alcohol everyday this week.   Epigastric abdominal pain since 7am. Nausea, abd pain, no vomiting. Mid abdominal pain. Pressure in chest  She drinks 12 24 oz of beer daily. Last tnight at 10PM.  With medications improves but doesn't stop.   No fever or chills. Has chills with period 2 weeks ago.  No hx of alcohol withdrawal in the past. Sometimes hand trembles after she hasn't drank in a while.  BP 136/91   ED course: Dilaudid, morphine, 1LNS  Lipase 182 Na 134 CO2 19 AST 91 ALT 66 AG 17 WBC 14 UA small  CXR neg  CT a/pelvis 1. Mild to moderate noncomplicated pancreatitis. 2. Hepatic steatosis and hepatomegaly. Morphology for which mild cirrhosis is a concern. 3.  Aortic Atherosclerosis (ICD10-I70.0).  PMH HLD [employer] [facility] program  Medications: Stopped taking cholesterol medications since July  Social: [facility]'t [facility]. She is apart of [facility] program and gets checked at clinic on [facility] for pregnancy checks. She feels safe at home. She is independent in ADLs.  Alcoholic pancreatitis   Leukocytosis WBC 14  Transaminitis AST 91 ALT 66  Prediabetes  HLD  Fib 4 of 1.2, adavnced fibrosis excluded   Follow-Up: -Start fenofibrate at discharge, goal triglycerides <500 -Start naltrexone at discharge  -Alcohol cessation

## 2022-08-23 NOTE — ED Triage Notes (Addendum)
Pt reports mid abdominal pain that started at 0700 this morning. Pt denies N/V and fever.

## 2022-08-23 NOTE — ED Provider Notes (Signed)
[facility] Provider Note   CSN: [id number] Arrival date & time: 08/23/22  1449     History  Chief Complaint  Patient presents with   Abdominal Pain    Nicole Velasquez is a 47 y.o. female.  Patient presents to the emergency department with her family.  Patient complains of epigastric abdominal pain began at 7 AM this morning.  Patient endorses nausea but denies vomiting, fever, diarrhea.  Patient reports a history of pancreatitis and endorses recent alcohol use.  She does not specify how much but endorses a high amount of alcohol usage.  Patient denies shortness of breath, chest pain, headache, urinary symptoms at this time.  Past medical history significant for pancreatitis, hyperlipidemia, metabolic syndrome, prediabetes  HPI     Home Medications Prior to Admission medications   Medication Sig Start Date End Date Taking? Authorizing Provider  atorvastatin (LIPITOR) 40 MG tablet TAKE 1 TABLET(40 MG) BY MOUTH DAILY 12/17/21   Jose Persia, MD  etonogestrel (NEXPLANON) 68 MG IMPL implant 68 mg by Subdermal route once. Implanted April 04, 2019    [provider]  icosapent Ethyl (VASCEPA) 1 g capsule Take 2 capsules (2 g total) by mouth 2 (two) times daily. 06/24/21   Jose Persia, MD  Ketotifen Fumarate (ALLERGY EYE DROPS OP) Place 1 drop into both eyes daily as needed (allergies/itching). Patient not taking: No sig reported    [provider]      Allergies    Patient has no known allergies.    Review of Systems   Review of Systems  Gastrointestinal:  Positive for abdominal pain and nausea. Negative for diarrhea and vomiting.    Physical Exam Updated Vital Signs BP 125/84   Pulse 100   Temp 98.1 F (36.7 C) (Oral)   Resp 20   SpO2 99%  Physical Exam Vitals and nursing note reviewed.  Constitutional:      Appearance: She is well-developed. She is diaphoretic.  HENT:     Head: Normocephalic  and atraumatic.  Eyes:     Conjunctiva/sclera: Conjunctivae normal.  Cardiovascular:     Rate and Rhythm: Regular rhythm. Tachycardia present.     Heart sounds: No murmur heard. Pulmonary:     Effort: Pulmonary effort is normal. No respiratory distress.     Breath sounds: Normal breath sounds.  Abdominal:     Palpations: Abdomen is soft.     Tenderness: There is abdominal tenderness in the epigastric area.  Musculoskeletal:        General: No swelling.     Cervical back: Neck supple.  Skin:    General: Skin is warm.     Capillary Refill: Capillary refill takes less than 2 seconds.  Neurological:     Mental Status: She is alert.  Psychiatric:        Mood and Affect: Mood normal.     ED Results / Procedures / Treatments   Labs (all labs ordered are listed, but only abnormal results are displayed) Labs Reviewed  LIPASE, BLOOD - Abnormal; Notable for the following components:      Result Value   Lipase 182 (*)    All other components within normal limits  COMPREHENSIVE METABOLIC PANEL - Abnormal; Notable for the following components:   Sodium 134 (*)    CO2 19 (*)    Glucose, Bld 114 (*)    BUN <5 (*)    AST 91 (*)    ALT 66 (*)  Anion gap 17 (*)    All other components within normal limits  CBC - Abnormal; Notable for the following components:   WBC 14.0 (*)    All other components within normal limits  URINALYSIS, ROUTINE W REFLEX MICROSCOPIC - Abnormal; Notable for the following components:   APPearance HAZY (*)    Hgb urine dipstick SMALL (*)    Protein, ur 30 (*)    Bacteria, UA RARE (*)    All other components within normal limits  I-STAT BETA HCG BLOOD, ED ([facility], [facility], [facility] ONLY)  TROPONIN I (HIGH SENSITIVITY)    EKG None  Radiology CT ABDOMEN PELVIS W CONTRAST  Result Date: 08/23/2022 CLINICAL DATA:  Mid abdominal pain.  Clinical pancreatitis. EXAM: CT ABDOMEN AND PELVIS WITH CONTRAST TECHNIQUE: Multidetector CT imaging of the abdomen and pelvis was  performed using the standard protocol following bolus administration of intravenous contrast. RADIATION DOSE REDUCTION: This exam was performed according to the departmental dose-optimization program which includes automated exposure control, adjustment of the mA and/or kV according to patient size and/or use of iterative reconstruction technique. CONTRAST:  51mL OMNIPAQUE IOHEXOL 350 MG/ML SOLN COMPARISON:  11/27/2019 FINDINGS: Lower chest: Clear lung bases.  Borderline cardiomegaly. Hepatobiliary: Hepatic steatosis and hepatomegaly at 18.6 cm. Caudate and lateral segment left liver lobe enlargement with mildly irregular hepatic capsule. Normal gallbladder, without biliary ductal dilatation. Pancreas: Moderate peripancreatic edema adjacent the head, neck, and uncinate process, including on 26/3. No pancreatic necrosis, duct dilatation, or well-defined peripancreatic fluid collection. Spleen: Normal in size, without focal abnormality. Adrenals/Urinary Tract: Normal adrenal glands. Normal kidneys, without hydronephrosis. Normal urinary bladder. Stomach/Bowel: Normal stomach, without wall thickening. Normal colon, appendix, and terminal ileum. Normal small bowel. Vascular/Lymphatic: Normal caliber of the aorta and branch vessels. Retroaortic left renal vein. Patent portal and splenic veins. No abdominopelvic adenopathy. Reproductive: Normal uterus and adnexa. Other: No significant free fluid.  No free intraperitoneal air. Small fat containing paraumbilical hernia. Musculoskeletal: No acute osseous abnormality. IMPRESSION: 1. Mild to moderate noncomplicated pancreatitis. 2. Hepatic steatosis and hepatomegaly. Morphology for which mild cirrhosis is a concern. 3.  Aortic Atherosclerosis (ICD10-I70.0). Electronically Signed   By: Abigail Miyamoto M.D.   On: 08/23/2022 17:46   DG Chest 1 View  Result Date: 08/23/2022 CLINICAL DATA:  Chest pain. EXAM: CHEST  1 VIEW COMPARISON:  January 10, 2016 FINDINGS: The heart size and  mediastinal contours are within normal limits. Both lungs are clear. The visualized skeletal structures are unremarkable. IMPRESSION: No active disease. Electronically Signed   By: Fidela Salisbury M.D.   On: 08/23/2022 16:06    Procedures Procedures    Medications Ordered in ED Medications  morphine (PF) 4 MG/ML injection 4 mg (4 mg Intravenous Given 08/23/22 1701)  sodium chloride 0.9 % bolus 1,000 mL (0 mLs Intravenous Stopped 08/23/22 1754)  ondansetron (ZOFRAN) injection 4 mg (4 mg Intravenous Given 08/23/22 1701)  iohexol (OMNIPAQUE) 350 MG/ML injection 75 mL (75 mLs Intravenous Contrast Given 08/23/22 1736)  HYDROmorphone (DILAUDID) injection 1 mg (1 mg Intravenous Given 08/23/22 1756)    ED Course/ Medical Decision Making/ A&P                             Medical Decision Making Amount and/or Complexity of Data Reviewed Labs: ordered.  Risk Prescription drug management. Decision regarding hospitalization.   This patient presents to the ED for concern of epigastric abdominal pain, this involves an extensive number of treatment  options, and is a complaint that carries with it a high risk of complications and morbidity.  The differential diagnosis includes but is not limited to pancreatitis, cholecystitis, appendicitis gastritis, others   Co morbidities that complicate the patient evaluation  History of acute pancreatitis, alcohol use   Additional history obtained:  Additional history obtained from family bedside External records from outside source obtained and reviewed including hospital notes from May 2021 for alcohol induced acute pancreatitis   Lab Tests:  I Ordered, and personally interpreted labs.  The pertinent results include: Lipase 182, AST 91, ALT 66, initial troponin 3, urinalysis with small hemoglobin, 30 protein, rare bacteria   Imaging Studies ordered:  I ordered imaging studies including chest x-ray and CT abdomen pelvis with contrast I independently  visualized and interpreted imaging which showed no active disease on chest x-ray CT scan shows: 1. Mild to moderate noncomplicated pancreatitis.  2. Hepatic steatosis and hepatomegaly. Morphology for which mild  cirrhosis is a concern.  3.  Aortic Atherosclerosis   I agree with the radiologist interpretation    Consultations Obtained:  I requested consultation with the internal medicine residency service,  and discussed lab and imaging findings as well as pertinent plan - they recommend: Admission   Problem List / ED Course / Critical interventions / Medication management   I ordered medication including saline, morphine, Zofran  Reevaluation of the patient after these medicines showed that the patient improved I have reviewed the patients home medicines and have made adjustments as needed   Test / Admission - Considered:  The patient has acute pancreatitis, likely alcohol induced.  Patient will need admission for IV fluids and pain management.        Final Clinical Impression(s) / ED Diagnoses Final diagnoses:  Alcohol-induced acute pancreatitis, unspecified complication status    Rx / DC Orders ED Discharge Orders     None         Ronny Bacon 08/23/22 1809    Regan Lemming, MD 08/25/22 1511

## 2022-08-23 NOTE — H&P (Cosign Needed)
Date: 08/23/2022               Patient Name:  Nicole Velasquez MRN: [id number]  DOB: 1975/11/29 Age / Sex: 47 y.o., female   PCP: Linus Galas, MD         Medical Service: Internal Medicine Teaching Service         Attending Physician: Dr. Evette Doffing, Mallie Mussel, *      First Contact: Dr. Angelique Blonder, DO Pager [phone]    Second Contact: Dr. Buddy Duty, DO Pager [phone]         After Hours (After 5p/  First Contact Pager: [phone]  weekends / holidays): Second Contact Pager: [phone]   SUBJECTIVE   Chief Complaint: Abdominal Pain  History of Present Illness:  Nicole Velasquez is a 47 year old female with past medical history of alcohol use disorder with previous alcoholic pancreatitis and hyperlipidemia who presented with acute onset epigastric pain radiating to her back associated with nausea after a week of heavy drinking.  She states the pain started about 7 AM and is also associated with some pressure in her chest and pain in her back as well as nausea without any vomiting.  She was able to eat half a burrito this morning and drink sparkling water but has not felt like eating the rest the day.  She states the pressure in her chest is bilateral and only associated with the epigastric pain.  She describes it as pleuritic.  She does not have any exertional chest pain or baseline.  Over the past week she has had about 288 ounces of beer a day with her last drink at about 10 PM yesterday.  Before this she would usually drink 72 ounces of beer a week.  She states she started drinking more after a personal issue with her daughters father which she did not want to elaborate on.  In 11/2019 she had a similar episode after heavy alcohol use and was treated for alcohol induced pancreatitis.  She denies any history of alcohol withdrawals but does sometimes get a hand tremor after drinking.  She denies any other changes in her health and has not been around anybody sick, started  or stopped any medications, had any fevers, chills, change in bowel or bladder habits, exertional chest pain, presyncope, cough, or any other noted symptoms other than what has been described.  She has been on medication for her high cholesterol but ran out in July of last year and has not had it since then.  ED Course: Presented with symptoms as above and diagnostics consistent with mild to moderate pancreatitis on imaging with a lipase of 182 and elevated AST at 91, ALT at 66, as well as a leukocytosis of 14.  She was treated with a 1 L fluid bolus and given some Dilaudid and morphine which helped her pain briefly.  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  Typical alcohol intake of 2-3x 24 ounce beers a week with recent increased to 12x 24 ounce beers a day for the past 7 days.  Family History:  Denies any known family history of diseases including cancers. Per note suggest that she has siblings with hypertriglyceridemia.  Allergies: Allergies as of 08/23/2022   (No Known Allergies)    Review of Systems: A complete ROS was negative except as per HPI.   OBJECTIVE:   Physical Exam: Blood pressure 135/84, pulse 100, temperature 98.1 F (36.7 C), temperature source Oral, resp. rate 16, height 5\' 4"  (1.626 m), weight  72.6 kg, SpO2 97 %.  Constitutional: Well-appearing middle-age female. In no acute distress. HENT: Normocephalic, atraumatic,  Eyes: Sclera non-icteric, PERRL, EOM intact Cardio: Tachycardic with regular rhythm. No murmurs, rubs, or gallops. 2+ bilateral radial and dorsalis pedis  pulses. Pulm:Clear to auscultation bilaterally. Normal work of breathing on room air. Abdomen: Soft, non-distended, positive bowel sounds.  Tenderness to palpation diffusely but primarily in the epigastric and right upper quadrant without rebound tenderness and negative Murphy sign. ZOX:[id number] for extremity edema. Skin:Warm and dry. Neuro:Alert and oriented x3. No focal deficit noted. Psych:Pleasant mood and affect.  Labs: CBC    Component Value Date/Time   WBC 14.0 (H) 08/23/2022 1529   RBC 4.13 08/23/2022 1529   HGB 12.5 08/23/2022 1529   HCT 38.3 08/23/2022 1529   PLT 259 08/23/2022 1529   MCV 92.7 08/23/2022 1529   MCH 30.3 08/23/2022 1529   MCHC 32.6 08/23/2022 1529   RDW 11.9 08/23/2022 1529   LYMPHSABS 5.8 (H) 11/30/2016 0040   MONOABS 1.1 (H) 11/30/2016 0040   EOSABS 0.5 11/30/2016 0040   BASOSABS 0.0 11/30/2016 0040     CMP     Component Value Date/Time   NA 134 (L) 08/23/2022 1529   K 3.8 08/23/2022 1529   CL 98 08/23/2022 1529   CO2 19 (L) 08/23/2022 1529   GLUCOSE 114 (H) 08/23/2022 1529   BUN <5 (L) 08/23/2022 1529   CREATININE 0.81 08/23/2022 1529   CALCIUM 9.3 08/23/2022 1529   PROT 7.4 08/23/2022 1529   ALBUMIN 3.9 08/23/2022 1529   AST 91 (H) 08/23/2022 1529   ALT 66 (H) 08/23/2022 1529   ALKPHOS 72 08/23/2022 1529   BILITOT 0.6 08/23/2022 1529   GFRNONAA >60 08/23/2022 1529   GFRAA >60 11/28/2019 0302   Lipase     Component Value Date/Time   LIPASE 182 (H) 08/23/2022 1529     Imaging: Chest x-ray without any evidence of airspace disease or any other acute abnormalities.  CT abdomen pelvis with contrast shows mild to moderate pancreatitis without complication as  well as hepatic steatosis and hepatomegaly concerning for mild cirrhosis.  EKG: personally reviewed my interpretation is sinus tachycardia with a rate of 101 with flipped T waves in the anterior leads and QTc of 482. Prior EKG from 11/2019 appears similar with a normal rate with less T wave changes in the anterior leads  ASSESSMENT & PLAN:   Assessment & Plan by Problem: Principal Problem:   Alcoholic pancreatitis Active Problems:   Steatosis of liver   Hypercholesterolemia   Alcohol use disorder   Alcoholic ketoacidosis   Transaminitis   Hypertriglyceridemia   Shelbie Franken Manzella is a 47 y.o. female with pertinent PMH of alcohol use disorder with previous alcohol induced pancreatitis and hyperlipidemia who presented with acute onset epigastric pain radiating to her  chest and back with nausea after 7 days of heavy alcohol intake and is admitted for alcohol-induced pancreatitis.  Mild alcoholic pancreatitis Symptoms of epigastric pain radiating to her back with nausea after heavy alcohol intake for the past 7 days, lipase of 182, and CT showing pancreatitis without local complications and no evidence of organ failure.  Low suspicion for gallstones and negative Murphy sign.  Triglycerides are elevated at 581 which is consistent with prior levels including 531 during her last episode of pancreatitis.  She responded well to IV fluids and pain medication in the ED which we will continue.  She is also hungry and would like to try to eat so we will start with a clear liquid diet and hope to advance to solid food in the morning. - Normal saline 125 mL/h - Dilaudid 1 mg every 4 hours as needed - May add Zofran as needed if still nauseous, QTc 482 - Multivitamin, folate, thiamine - Clear liquid diet, advance as tolerated  Alcoholic ketoacidosis Transaminitis Additional labs show a bicarb of 19 and an anion gap of 17 with AST of 91 and ALT of 66 with remote but prior labs showing normal values.   No history of diabetes, SGLT2i use, or severely elevated glucose.  Consistent with heavy alcohol use and we will treat as above with fluids and will repeat labs in the morning. - CMP in the morning, diet and fluids as above  Alcohol use disorder Hepatic steatosis Imaging here is concerning for mild cirrhosis.  Fib 4 score of 1.2 with advanced fibrosis ruled out.  No thrombocytopenia or hypoalbuminemia.  Patient is interested in decreasing or stopping alcohol use and is amenable to starting naltrexone. - Plan to start naltrexone prior to discharge - CIWA q4h w/o ativan  Leukocytosis Patient presents with a leukocytosis of 14 without recent fevers or sick contacts.  No evidence of pneumonia, cholecystitis, symptomatic UTI, or other infection at this time.  Urinalysis with a small amount of blood and protein as well as rare bacteria and hyaline casts on microscopy without white blood cells or red blood cells.  Overall consistent with pancreatitis and we will not workup further for infection at this time or start antibiotics.  Hyponatremia Sodium is mildly low at 134 and prior labs do not show any history of hyponatremia.  Most likely secondary to heavy beer intake and we will monitor with repeat labs in the morning. - CMP in the morning, fluids as above  Chest discomfort Patient describes chest discomfort associated with her epigastric abdominal pain that is bilateral and pleuritic.  No cough, dyspnea, presyncope, syncope, or any history of exertional chest pain.  Troponin in the ED of 3 and EKG without acute ischemia.  Intermittently tachycardic with a Wells score of 0-1.5.  We will monitor this but appears to be due to her pancreatitis.  Hyperlipidemia Hypertriglyceridemia She has been prescribed atorvastatin and icosapent ethyl in the past but has not been taking these medications since July.  Prior notes state that she has siblings with similar labs.  Direct LDL cholesterol 2 years ago was 69.5  (reference range 0-99).  Lipid panels over the last 4 years have shown elevated total cholesterol in the 200s, low HDL in the 20s, and elevated triglycerides above 500 up to 763.  Lipid panel here is consistent with prior. - LDL direct pending - Hold resuming statin with elevated transaminases  Hyperglycemia Patient is mildly hyperglycemic here.  Last A1c in 03/2021 of 5.7.  Will  continue to monitor and if persistently elevated we will repeat A1c.  Diet:  CLD VTE: Enoxaparin IVF: NS,125cc/hr Code: Full  Dispo: Admit patient to Inpatient with expected length of stay greater than 2 midnights.  Signed: Johny Blamer, DO Internal Medicine Resident, PGY-1 Pager# [phone]  08/23/2022, 11:14 PM   Dr. Angelique Blonder, DO Pager [phone]

## 2022-08-24 ENCOUNTER — Encounter (HOSPITAL_COMMUNITY): Payer: Self-pay | Admitting: Student in an Organized Health Care Education/Training Program

## 2022-08-24 ENCOUNTER — Other Ambulatory Visit: Payer: Self-pay

## 2022-08-24 DIAGNOSIS — E8729 Other acidosis: Secondary | ICD-10-CM | POA: Diagnosis not present

## 2022-08-24 DIAGNOSIS — K852 Alcohol induced acute pancreatitis without necrosis or infection: Secondary | ICD-10-CM | POA: Diagnosis not present

## 2022-08-24 DIAGNOSIS — F1090 Alcohol use, unspecified, uncomplicated: Secondary | ICD-10-CM | POA: Diagnosis not present

## 2022-08-24 LAB — COMPREHENSIVE METABOLIC PANEL
ALT: 43 U/L (ref 0–44)
AST: 50 U/L — ABNORMAL HIGH (ref 15–41)
Albumin: 3 g/dL — ABNORMAL LOW (ref 3.5–5.0)
Alkaline Phosphatase: 79 U/L (ref 38–126)
Anion gap: 11 (ref 5–15)
BUN: <5 mg/dL — ABNORMAL LOW (ref 6–20)
CO2: 22 mmol/L (ref 22–32)
Calcium: 8.5 mg/dL — ABNORMAL LOW (ref 8.9–10.3)
Chloride: 100 mmol/L (ref 98–111)
Creatinine, Ser: 0.71 mg/dL (ref 0.44–1.00)
GFR, Estimated: >60 mL/min (ref >60)
Glucose, Bld: 106 mg/dL — ABNORMAL HIGH (ref 70–99)
Potassium: 3.5 mmol/L (ref 3.5–5.1)
Sodium: 133 mmol/L — ABNORMAL LOW (ref 135–145)
Total Bilirubin: 1.4 mg/dL — ABNORMAL HIGH (ref 0.3–1.2)
Total Protein: 6 g/dL — ABNORMAL LOW (ref 6.5–8.1)

## 2022-08-24 LAB — CBC
HCT: 33.1 % — ABNORMAL LOW (ref 36.0–46.0)
Hemoglobin: 10.9 g/dL — ABNORMAL LOW (ref 12.0–15.0)
MCH: 30.8 pg (ref 26.0–34.0)
MCHC: 32.9 g/dL (ref 30.0–36.0)
MCV: 93.5 fL (ref 80.0–100.0)
Platelets: 190 10*3/uL (ref 150–400)
RBC: 3.54 MIL/uL — ABNORMAL LOW (ref 3.87–5.11)
RDW: 11.9 % (ref 11.5–15.5)
WBC: 10.3 10*3/uL (ref 4.0–10.5)
nRBC: 0 % (ref 0.0–0.2)

## 2022-08-24 LAB — HIV ANTIBODY (ROUTINE TESTING W REFLEX): HIV Screen 4th Generation wRfx: NONREACTIVE

## 2022-08-24 MED ORDER — FENOFIBRATE 54 MG PO TABS: 54.0000 mg | ORAL_TABLET | Freq: Every day | ORAL | Status: DC

## 2022-08-24 MED ORDER — ACETAMINOPHEN 500 MG PO TABS
1000.0000 mg | ORAL_TABLET | Freq: Three times a day (TID) | ORAL | Status: DC
  Administered 2022-08-24 – 2022-08-25 (×4): 1000 mg via ORAL
  Filled 2022-08-24 (×4): qty 2

## 2022-08-24 MED ORDER — ATORVASTATIN CALCIUM 40 MG PO TABS: 40.0000 mg | ORAL_TABLET | Freq: Every day | ORAL | Status: DC

## 2022-08-24 MED ORDER — ONDANSETRON HCL 4 MG/2ML IJ SOLN: 4.0000 mg | Freq: Three times a day (TID) | INTRAMUSCULAR | Status: DC | PRN

## 2022-08-24 MED ORDER — DIPHENHYDRAMINE HCL 25 MG PO CAPS
25.0000 mg | ORAL_CAPSULE | Freq: Every evening | ORAL | Status: DC | PRN
  Administered 2022-08-24: 25 mg via ORAL
  Filled 2022-08-24: qty 1

## 2022-08-24 MED ORDER — ICOSAPENT ETHYL 1 G PO CAPS: 2.0000 g | ORAL_CAPSULE | Freq: Two times a day (BID) | ORAL | Status: DC

## 2022-08-24 NOTE — Progress Notes (Signed)
HD#1 SUBJECTIVE:  Patient Summary: Nicole Velasquez is a 47 y.o. with a pertinent PMH of alcohol use disorder, hyperlipidemia, and metabolic syndrome who presented with epigastric pain and admitted for acute interstitial pancreatitis  Overnight Events: No acute events overnight  Interim History: The patient was seen and evaluated with her daughter at the bedside. She is still having some pain in her abdomen, especially with eating, although she feels improved. She has some nausea, but no vomiting. The patient has been passing gas, but has not had a bowel movement since she has been in the hospital.   OBJECTIVE:  Vital Signs: Vitals:   08/23/22 2045 08/23/22 2052 08/23/22 2223 08/24/22 0425  BP: 135/84  (!) 132/95 123/81  Pulse: 100  93 91  Resp: 16  18 18   Temp: 98.1 F (36.7 C)  98.2 F (36.8 C) 98 F (36.7 C)  TempSrc: Oral  Oral Oral  SpO2: 97%  99% 98%  Weight:  72.6 kg    Height:  5\' 4"  (1.626 m)     Supplemental O2: Room Air SpO2: 98 %  Filed Weights   08/23/22 2052  Weight: 72.6 kg     Intake/Output Summary (Last 24 hours) at 08/24/2022 0634 Last data filed at 08/24/2022 0421 Gross per 24 hour  Intake 1159.28 ml  Output 0 ml  Net 1159.28 ml   Net IO Since Admission: 1,159.28 mL [08/24/22 0634]  Physical Exam: General: Appears uncomfortable, but is in no acute distress. CV: RRR. No murmurs. Pulmonary: Lungs CTAB. Normal work of breathing Abdominal: Soft, nondistended. Moderately tender to palpation in epigastric region. Normal bowel sounds.   Skin: Warm and dry.  Neuro: A&Ox3. No focal deficit. Psych: Normal mood and affect     ASSESSMENT/PLAN:  Assessment: Principal Problem:   Alcoholic pancreatitis Active Problems:   Steatosis of liver   Hypercholesterolemia   Alcohol use disorder   Alcoholic ketoacidosis   Transaminitis   Hypertriglyceridemia   Plan: Acute interstitial pancreatitis Patient has been receiving IVF and pain medications  since admission, with an improvement in her overall condition. She does still have some pain and discomfort with food, however, this is improving. She has some nausea still, but no vomiting. Although her triglycerides are elevated, it is most likely that this episode of pancreatitis was triggered by recent binge drinking.  - Continue NS 125 cc/hr - Tylenol 1000 mg TID  - Dilaudid 1 mg q4h PRN for severe pain - Zofran PRN for nausesa, vomiting - Continue to advance diet as tolerated   Alcohol use disorder Hepatic steatosis Patient has no s/s of alcohol withdrawal on exam today. Fib 4 score calculated last night, noted to be 1.2 with advanced fibrosis ruled out. Will consider naltrexone at discharge, if patient is amenable.  - Continue CIWA monitoring  Alcoholic ketoacidosis Bicarb improved to 22. AST/ALT also improved to 50/43.  - Diet and fluids as above  Hypertriglyceridemia Trigs elevated to 581, have been as high as the 700s in the past. Holding atorvastatin and Vaspeca (omega-3 fatty acid) in the setting of elevated transaminases. Will start fenofibrate once daily at discharge (goal trigs <500).  Best Practice: Diet: Liquid, advance as tolerated IVF: Fluids: 0.9NS, Rate: 125cc/hr VTE: enoxaparin (LOVENOX) injection 40 mg Start: 08/24/22 1000 Code: Full AB: None Family Contact: Daughter, at bedside. DISPO: Anticipated discharge in 1-2 days to Home pending  improvement in pain control .  Signature: Buddy Duty, D.O.  Internal Medicine Resident, PGY-2 [facility]  Pager: [phone] 6:34 AM, 08/24/2022   Please contact the on call pager after 5 pm and on weekends at [phone].

## 2022-08-24 NOTE — Plan of Care (Signed)
  Problem: Health Behavior/Discharge Planning: Goal: Ability to manage health-related needs will improve Outcome: Progressing   Problem: Clinical Measurements: Goal: Ability to maintain clinical measurements within normal limits will improve Outcome: Progressing Goal: Will remain free from infection Outcome: Progressing Goal: Diagnostic test results will improve Outcome: Progressing Goal: Respiratory complications will improve Outcome: Progressing Goal: Cardiovascular complication will be avoided Outcome: Progressing   Problem: Activity: Goal: Risk for activity intolerance will decrease Outcome: Progressing   Problem: Nutrition: Goal: Adequate nutrition will be maintained Outcome: Progressing   Problem: Coping: Goal: Level of anxiety will decrease Outcome: Progressing   Problem: Elimination: Goal: Will not experience complications related to bowel motility Outcome: Progressing Goal: Will not experience complications related to urinary retention Outcome: Progressing   Problem: Pain Managment: Goal: General experience of comfort will improve Outcome: Progressing   Problem: Safety: Goal: Ability to remain free from injury will improve Outcome: Progressing   Problem: Skin Integrity: Goal: Risk for impaired skin integrity will decrease Outcome: Progressing   Problem: Education: Goal: Knowledge of Pancreatitis treatment and prevention will improve Outcome: Progressing   Problem: Health Behavior/Discharge Planning: Goal: Ability to formulate a plan to maintain an alcohol-free life will improve Outcome: Progressing   Problem: Nutritional: Goal: Ability to achieve adequate nutritional intake will improve Outcome: Progressing   Problem: Clinical Measurements: Goal: Complications related to the disease process, condition or treatment will be avoided or minimized Outcome: Progressing   Problem: Education: Goal: Knowledge of disease or condition will  improve Outcome: Progressing Goal: Understanding of discharge needs will improve Outcome: Progressing   Problem: Health Behavior/Discharge Planning: Goal: Ability to identify changes in lifestyle to reduce recurrence of condition will improve Outcome: Progressing Goal: Identification of resources available to assist in meeting health care needs will improve Outcome: Progressing   Problem: Physical Regulation: Goal: Complications related to the disease process, condition or treatment will be avoided or minimized Outcome: Progressing   Problem: Safety: Goal: Ability to remain free from injury will improve Outcome: Progressing

## 2022-08-24 NOTE — Evaluation (Signed)
Physical Therapy Evaluation and Discharge Patient Details Name: Nicole Velasquez MRN: [id number] DOB: 1975-08-05 Today's Date: 08/24/2022  History of Present Illness  Pt presenting 2/3 with epigastric abdominal pain and nausea (no emesis). Pt found to have mild to mod pancreatitis, hepatic steatosis, and hepatomegaly with concern for mild cirrhosis, as well as aortic atherosclerosis.  PMH significant for alcohol use disorder with previous alcoholic pancreatitis  Clinical Impression   Patient evaluated by Physical Therapy with no further acute PT needs identified. Walking well and managing independently in the room despite abdominal discomfort; All education has been completed and the patient has no further questions. Encouraged pt to walk the hallways 3-4 times a day while in hospital. See below for any follow-up Physical Therapy or equipment needs. PT is signing off. Thank you for this referral.        Recommendations for follow up therapy are one component of a multi-disciplinary discharge planning process, led by the attending physician.  Recommendations may be updated based on patient status, additional functional criteria and insurance authorization.  Follow Up Recommendations No PT follow up      Assistance Recommended at Discharge PRN  Patient can return home with the following  Assistance with cooking/housework    Equipment Recommendations None recommended by PT  Recommendations for Other Services  Other (comment) (Alcohol rehab; Does BCBS cover inpt alcohol rehab? or Intensive Outpt Alcohol Rehab?)    Functional Status Assessment Patient has not had a recent decline in their functional status     Precautions / Restrictions Precautions Precautions: None Restrictions Weight Bearing Restrictions: No      Mobility  Bed Mobility Overal bed mobility: Modified Independent             General bed mobility comments: use of log roll. Encourgaed pt to continue log roll  at home for comfort    Transfers Overall transfer level: Modified independent                      Ambulation/Gait Ambulation/Gait assistance: Modified independent (Device/Increase time) Gait Distance (Feet): 120 Feet Assistive device: None, IV Pole Gait Pattern/deviations: Step-through pattern       General Gait Details: slow gait, but steady  Stairs            Wheelchair Mobility    Modified Rankin (Stroke Patients Only)       Balance Overall balance assessment: Modified Independent                                           Pertinent Vitals/Pain Pain Assessment Pain Assessment: Faces Faces Pain Scale: Hurts little more Pain Location: stomach Pain Descriptors / Indicators: Cramping, Grimacing Pain Intervention(s): Monitored during session    Home Living Family/patient expects to be discharged to:: Private residence Living Arrangements: Spouse/significant other;Children Available Help at Discharge: Family Type of Home: House Home Access: Stairs to enter   Technical brewer of Steps: 2   Home Layout: One level Home Equipment: None      Prior Function Prior Level of Function : Independent/Modified Independent;Driving             Mobility Comments: no AD ADLs Comments: indep in ADL and IADL     Hand Dominance   Dominant Hand: Right    Extremity/Trunk Assessment   Upper Extremity Assessment Upper Extremity Assessment: Defer to OT evaluation  Lower Extremity Assessment Lower Extremity Assessment: Overall WFL for tasks assessed    Cervical / Trunk Assessment Cervical / Trunk Assessment: Normal  Communication   Communication: No difficulties;Prefers language other than English (spanish first language)  Cognition Arousal/Alertness: Awake/alert Behavior During Therapy: WFL for tasks assessed/performed Overall Cognitive Status: Within Functional Limits for tasks assessed                                  General Comments: Following all commands, pleasant and conversational. Oriented. Daughter present and confirming thinking seems normal        General Comments General comments (skin integrity, edema, etc.): slowed pace of gait; guarding stomach throughout session    Exercises     Assessment/Plan    PT Assessment Patient does not need any further PT services  PT Problem List         PT Treatment Interventions      PT Goals (Current goals can be found in the Care Plan section)  Acute Rehab PT Goals Patient Stated Goal: less pain PT Goal Formulation: All assessment and education complete, DC therapy    Frequency       Co-evaluation               AM-PAC PT "6 Clicks" Mobility  Outcome Measure Help needed turning from your back to your side while in a flat bed without using bedrails?: None Help needed moving from lying on your back to sitting on the side of a flat bed without using bedrails?: None Help needed moving to and from a bed to a chair (including a wheelchair)?: None Help needed standing up from a chair using your arms (e.g., wheelchair or bedside chair)?: None Help needed to walk in hospital room?: None Help needed climbing 3-5 steps with a railing? : None 6 Click Score: 24    End of Session   Activity Tolerance: Patient tolerated treatment well Patient left: with family/visitor present (managing independently in bathroom) Nurse Communication: Mobility status (independent) PT Visit Diagnosis: Pain Pain - part of body:  (abdominal)    Time: 0086-7619 PT Time Calculation (min) (ACUTE ONLY): 18 min   Charges:   PT Evaluation $PT Eval Low Complexity: [place], [place] Office [place] 08/24/2022, 1:44 PM

## 2022-08-24 NOTE — Progress Notes (Signed)
New Admission Note:   Arrival Method: Via stretcher from ED Mental Orientation:  A & Ox4 Telemetry: Box 5M20 Assessment: Completed Skin:  Dry with bruising to bilateral knees - otherwise iintact IV:  RT AC Pain: Mid Epigastric Pain Tubes:  None Admission: Completed 5 MW Orientation: Patient has been orientated to the room, unit and staff.  Family:  Daughter and partner at bedside  Patient had earrings, necklace, and ring on.  Patient has glasses at home but does not wear them.  She also brought cellphone and  Advertising copywriter.  She brought her purse and wallet.  Advised that [facility] is not responsible for valuables.  I advised to send purse and wallet home with family.  Daughter stated she would be responsible since she is staying the night with the patient.  Orders have been reviewed and implemented. Will continue to monitor the patient. Call light has been placed within reach and bed alarm has been activated.   Earleen Reaper RN Phone number: [phone]

## 2022-08-24 NOTE — Evaluation (Signed)
Occupational Therapy Evaluation Patient Details Name: Alfredo Collymore MRN: [id number] DOB: 1976-06-15 Today's Date: 08/24/2022   History of Present Illness Pt presenting 2/3 with epigastric abdominal pain and nausea (no emesis). Pt found to have mild to mod pancreatitis, hepatic steatosis, and hepatomegaly with concern for mild cirrhosis, as well as aortic atherosclerosis.  PMH significant for alcohol use disorder with previous alcoholic pancreatitis   Clinical Impression   PTA, pt lived with her family and was independent. Upon eval, pt limited by pain and fatigue. Pt reporting occasional DOE with functional mobility to restroom since admission, but not observed during session and VSS. Pt performing oral care and functional mobility into hall with mod I. Educated regarding compensatory techniques for LB ADL to optimize comfort and decrease pain. Will follow acutely to address energy conservation/activity tolerance, but do not anticipate further OT needs after discharge.      Recommendations for follow up therapy are one component of a multi-disciplinary discharge planning process, led by the attending physician.  Recommendations may be updated based on patient status, additional functional criteria and insurance authorization.   Follow Up Recommendations  No OT follow up     Assistance Recommended at Discharge PRN  Patient can return home with the following A little help with bathing/dressing/bathroom (on request)    Functional Status Assessment  Patient has had a recent decline in their functional status and demonstrates the ability to make significant improvements in function in a reasonable and predictable amount of time.  Equipment Recommendations  None recommended by OT (Pt not interested)    Recommendations for Other Services       Precautions / Restrictions        Mobility Bed Mobility Overal bed mobility: Modified Independent             General bed mobility  comments: use of log roll. Encourgaed pt to continue log roll at home for comfort    Transfers Overall transfer level: Modified independent                 General transfer comment: increased time      Balance Overall balance assessment: Modified Independent                                         ADL either performed or assessed with clinical judgement   ADL Overall ADL's : Needs assistance/impaired Eating/Feeding: Independent   Grooming: Oral care;Standing;Modified independent   Upper Body Bathing: Modified independent;Sitting   Lower Body Bathing: Modified independent;Sit to/from stand   Upper Body Dressing : Modified independent;Sitting Upper Body Dressing Details (indicate cue type and reason): donned new gown with mod I (clasped buttons around arm to navigate IV as well). OT tied bow in bakc Lower Body Dressing: With caregiver independent assisting Lower Body Dressing Details (indicate cue type and reason): when offered socks, daughter present and donning for pt. OT providing education regarding use of comensatory techniques to decrease the pain experience. Toilet Transfer: Modified Independent;Ambulation Toilet Transfer Details (indicate cue type and reason): increased time due to pain         Functional mobility during ADLs: Modified independent General ADL Comments: incresaed time. pt reports intermittent SOB during mobility, but not observed during session and VSS     Vision Baseline Vision/History: 0 No visual deficits Ability to See in Adequate Light: 0 Adequate Patient Visual Report: No change from  baseline       Perception     Praxis      Pertinent Vitals/Pain Pain Assessment Pain Assessment: Faces Faces Pain Scale: Hurts even more Pain Location: stomach Pain Descriptors / Indicators: Constant, Cramping, Aching, Guarding, Discomfort Pain Intervention(s): Limited activity within patient's tolerance, Monitored during session      Hand Dominance Right   Extremity/Trunk Assessment Upper Extremity Assessment Upper Extremity Assessment: Overall WFL for tasks assessed   Lower Extremity Assessment Lower Extremity Assessment: Defer to PT evaluation   Cervical / Trunk Assessment Cervical / Trunk Assessment: Normal   Communication Communication Communication: No difficulties;Prefers language other than English (spanish first language)   Cognition Arousal/Alertness: Awake/alert Behavior During Therapy: WFL for tasks assessed/performed Overall Cognitive Status: Within Functional Limits for tasks assessed                                 General Comments: Following all commands, pleasant and conversational. Oriented. Daughter present and confirming thinking seems normal     General Comments  slowed pace of gait; guarding stomach throughout session    Exercises     Shoulder Instructions      Home Living Family/patient expects to be discharged to:: Private residence Living Arrangements: Spouse/significant other;Children Available Help at Discharge: Family Type of Home: House Home Access: Stairs to enter Technical brewer of Steps: 2   Home Layout: One level     Bathroom Shower/Tub: Teacher, early years/pre: Standard     Home Equipment: None          Prior Functioning/Environment Prior Level of Function : Independent/Modified Independent;Driving             Mobility Comments: no AD ADLs Comments: indep in ADL and IADL        OT Problem List: Decreased strength;Decreased activity tolerance;Impaired balance (sitting and/or standing);Pain      OT Treatment/Interventions: Self-care/ADL training;Therapeutic exercise;Energy conservation;DME and/or AE instruction;Therapeutic activities;Patient/family education;Balance training    OT Goals(Current goals can be found in the care plan section) Acute Rehab OT Goals Patient Stated Goal: decr pain OT Goal  Formulation: With patient Time For Goal Achievement: 09/07/22 Potential to Achieve Goals: Good  OT Frequency: Min 2X/week    Co-evaluation              AM-PAC OT "6 Clicks" Daily Activity     Outcome Measure Help from another person eating meals?: None Help from another person taking care of personal grooming?: None Help from another person toileting, which includes using toliet, bedpan, or urinal?: None Help from another person bathing (including washing, rinsing, drying)?: None Help from another person to put on and taking off regular upper body clothing?: None Help from another person to put on and taking off regular lower body clothing?: None 6 Click Score: 24   End of Session Equipment Utilized During Treatment: Gait belt Nurse Communication: Mobility status  Activity Tolerance: Patient tolerated treatment well Patient left: in bed;with call bell/phone within reach;with family/visitor present (bed not set on entry;checked with RN and she reports this is ok)  OT Visit Diagnosis: Unsteadiness on feet (R26.81);Muscle weakness (generalized) (M62.81);Other abnormalities of gait and mobility (R26.89);Pain Pain - part of body:  (stomach)                Time: 0109-3235 OT Time Calculation (min): 22 min Charges:  OT General Charges $OT Visit: 1 Visit OT Evaluation $OT Eval Low  Complexity: 1 Low  Elder Cyphers, OTR/L [facility] Acute Rehabilitation Office: [phone]   Magnus Ivan 08/24/2022, 12:36 PM

## 2022-08-25 ENCOUNTER — Other Ambulatory Visit (HOSPITAL_COMMUNITY): Payer: Self-pay

## 2022-08-25 DIAGNOSIS — E8729 Other acidosis: Secondary | ICD-10-CM | POA: Diagnosis not present

## 2022-08-25 DIAGNOSIS — K852 Alcohol induced acute pancreatitis without necrosis or infection: Secondary | ICD-10-CM | POA: Diagnosis not present

## 2022-08-25 DIAGNOSIS — F1090 Alcohol use, unspecified, uncomplicated: Secondary | ICD-10-CM | POA: Diagnosis not present

## 2022-08-25 LAB — BASIC METABOLIC PANEL
Anion gap: 10 (ref 5–15)
BUN: <5 mg/dL — ABNORMAL LOW (ref 6–20)
CO2: 23 mmol/L (ref 22–32)
Calcium: 8.6 mg/dL — ABNORMAL LOW (ref 8.9–10.3)
Chloride: 99 mmol/L (ref 98–111)
Creatinine, Ser: 0.61 mg/dL (ref 0.44–1.00)
GFR, Estimated: >60 mL/min (ref >60)
Glucose, Bld: 107 mg/dL — ABNORMAL HIGH (ref 70–99)
Potassium: 3.4 mmol/L — ABNORMAL LOW (ref 3.5–5.1)
Sodium: 132 mmol/L — ABNORMAL LOW (ref 135–145)

## 2022-08-25 LAB — MAGNESIUM: Magnesium: 1.9 mg/dL (ref 1.7–2.4)

## 2022-08-25 MED ORDER — NALTREXONE HCL 50 MG PO TABS
50.0000 mg | ORAL_TABLET | Freq: Every day | ORAL | 0 refills | Status: DC
  Filled 2022-08-25: qty 30, 30d supply, fill #0

## 2022-08-25 MED ORDER — HYDROMORPHONE HCL 2 MG PO TABS
1.0000 mg | ORAL_TABLET | ORAL | 0 refills | Status: AC | PRN
  Filled 2022-08-25: qty 9, 3d supply, fill #0

## 2022-08-25 MED ORDER — ACETAMINOPHEN 500 MG PO TABS
1000.0000 mg | ORAL_TABLET | Freq: Three times a day (TID) | ORAL | 0 refills | Status: AC | PRN
  Filled 2022-08-25: qty 30, 5d supply, fill #0

## 2022-08-25 MED ORDER — FENOFIBRATE 48 MG PO TABS
48.0000 mg | ORAL_TABLET | Freq: Every day | ORAL | 0 refills | Status: AC
  Filled 2022-08-25: qty 30, 30d supply, fill #0

## 2022-08-25 NOTE — Progress Notes (Signed)
   Subjective: No acute events overnight.   Patient resting comfortably in bed, daughter also at bedside. She states she had abdominal pain overnight that made sleeping difficult, pain was relieved by opioids. She endorsed ongoing abdominal pain that is worse after drinking water. She states she has eaten a little broth this morning. Endorses passing flatus, denies nausea or vomiting, denies bowel movement.   Objective:  Vital signs in last 24 hours: Vitals:   08/24/22 1215 08/24/22 1647 08/24/22 2130 08/25/22 0859  BP: 108/75 118/82 139/78 117/81  Pulse: 82 80 89 87  Resp:  18 18   Temp:  98.6 F (37 C) 98.6 F (37 C) 98.4 F (36.9 C)  TempSrc:  Oral Oral Oral  SpO2:  98% 99% 95%  Weight:      Height:       Weight change:   Intake/Output Summary (Last 24 hours) at 08/25/2022 1041 Last data filed at 08/25/2022 0900 Gross per 24 hour  Intake 480 ml  Output 0 ml  Net 480 ml    General:  CV: RRR Pulm: Normal respiratory effort on room air Abd: Normoactive bowel sounds. Soft, non-distended. Tenderness worse in upper quadrants and epigastrium with voluntary guarding. No rigidity.  Skin: Warm and dry Neuro: A&Ox4 Psych: Pleasant, appropriate affect  Assessment/Plan:  Principal Problem:   Alcoholic pancreatitis Active Problems:   Alcohol use disorder   Alcoholic ketoacidosis   Transaminitis   Hypertriglyceridemia  Nicole Velasquez is a 47 year old woman with a history significant for alcohol use disorder, hyperlipidemia, and hypertriglyceridemia who presented for epigastric pain in the setting of recent increased alcohol use and admitted for acute interstitial pancreatitis, remains stable today on supportive care.   Acute interstitial pancreatitis  Patient is overall stable, able to tolerate liquids without nausea/vomiting but still causing her discomfort. She received PRN dilauded q4-6h overnight and this morning. Will trial advancing diet today and encouraged out of  bed with possible discharge later this afternoon if she improves and is able to eat, otherwise likely discharge tomorrow.  -Tylenol 1000mg  TID -Dilaudid tablet 1mg  q4h PRN -IV Zofran 4mg  q8h PRN -Soft diet, advance as tolerated  Alcohol use disorder Hepatic steatosis CIWA 0 overnight, no evidence of alcohol withdrawal on assessment today. Will discuss alcohol cessation and naltrexone with patient before discharge.  -Continue CIWA  Alcoholic ketoacidosis - resolved  Hypertriglyceridemia  -Start fenofibrate at discharge   Full Code Diet: Soft, advance as tolerated IVF: None VTE: Lovenox 40 mg    LOS: 2 days   Spero Curb, Medical Student 08/25/2022, 10:41 AM

## 2022-08-25 NOTE — Discharge Summary (Cosign Needed Addendum)
Name: Nicole Velasquez MRN: [id number] DOB: 31-Oct-1975 47 y.o. PCP: Lyndle Herrlich, MD  Date of Admission: 08/23/2022  3:12 PM Date of Discharge: 08/25/2022 Attending Physician: Tyson Alias, *  Discharge Diagnosis: 1. Principal Problem:   Alcoholic pancreatitis Active Problems:   Alcohol use disorder   Alcoholic ketoacidosis   Transaminitis   Hypertriglyceridemia   Discharge Medications: Allergies as of 08/25/2022   No Known Allergies      Medication List     TAKE these medications    acetaminophen 500 MG tablet Commonly known as: TYLENOL Take 2 tablets (1,000 mg total) by mouth every 8 (eight) hours as needed for mild pain.   ALLERGY EYE DROPS OP Place 1 drop into both eyes daily as needed (allergies/itching).   atorvastatin 40 MG tablet Commonly known as: LIPITOR TAKE 1 TABLET(40 MG) BY MOUTH DAILY   etonogestrel 68 MG Impl implant Commonly known as: NEXPLANON 68 mg by Subdermal route once. Implanted April 04, 2019   fenofibrate 48 MG tablet Commonly known as: Tricor Take 1 tablet (48 mg total) by mouth daily.   HYDROmorphone 2 MG tablet Commonly known as: DILAUDID Take 1/2 tablet (1 mg total) by mouth every 4 (four) hours as needed for up to 3 days for severe pain.   icosapent Ethyl 1 g capsule Commonly known as: Vascepa Take 2 capsules (2 g total) by mouth 2 (two) times daily.   naltrexone 50 MG tablet Commonly known as: DEPADE Take 1 tablet (50 mg total) by mouth daily. START AFTER YOU ARE FINISHED WITH DILAUDID PAIN MEDICATION Start taking on: August 31, 2022        Disposition and follow-up:   [name] was discharged from [facility] in Good condition.  At the hospital follow up visit please address:  1.  Acute interstitial pancreatitis 2/2 Alcohol use Follow up for resolution of symptoms.  Alcohol use disorder Patient discharged with naltrexone, follow up on if treatment is  helping. Patient also endorsed home stressors, consider reevaluating if patient feels safe at home.   Hypertriglyceridemia Patient started on fenofibrate on discharge, goal triglycerides <500 at follow-up.   2.  Labs / imaging needed at time of follow-up: Lipid panel  3.  Pending labs/ test needing follow-up: None  Follow-up Appointments:  Appointment Details             09/01/2022 9:15 AM Doran Stabler, DO       Hospital Course by problem list:  Milderd Velasquez is a 47 year old woman with a history significant for alcohol use disorder, hyperlipidemia, and hypertriglyceridemia who presented for epigastric pain in the setting of recent increased alcohol use and admitted for management of acute interstitial pancreatitis.  Acute interstitial pancreatitis 2/2 Alcohol use Patient presented to the ED on 2/3 for acute epigastric pain and nausea following a week of heavily increased drinking. She states this was triggered by stress at home with her children and her children's father. Patient's labs significant for Lipase 182, AST 91 ALT 66, Triglycerides 581. CT showed evidence of uncomplicated pancreatitis. Patient admitted and improved with supportive care with IVF and pain control. Tolerated solid food without nausea and vomiting. Pain well controlled, will send 3 days of dilaudid at discharge.   Alcohol Use Disorder Hepatic steatosis  CT showed evidence of hepatic steatosis, Fib 4 score of 1.2, advanced fibrosis ruled out. No evidence of alcohol withdrawal this admission. Patient agreeable to starting naltrexone on discharge.   Hyperlipidemia  Hypertriglyceridemia Patient reports she has not been taking atorvastatin and icosapent ethyl since July. Triglycerides 581 on admission, patient restarted on atorvastatin and started on fenofibrate on discharge with triglyceride goal <500.   Discharge Subjective: Patient reports improved PO intake, was able to eat lunch and had bowel movements  this afternoon.   Discharge Exam:   BP 117/81 (BP Location: Left Arm)   Pulse 87   Temp 98.4 F (36.9 C) (Oral)   Resp 18   Ht 5\' 4"  (1.626 m)   Wt 72.6 kg   SpO2 95%   BMI 27.46 kg/m   General: appears comfortable in bed, in no acute distress CV: RRR Pulm: Normal respiratory effort on room air Abd: Normoactive bowel sounds. Soft, non-distended. Tenderness worse in upper quadrants and epigastrium with voluntary guarding. No rigidity.  Skin: Warm and dry Neuro: A&Ox4 Psych: Pleasant, appropriate affect   Pertinent Labs, Studies, and Procedures:      Latest Ref Rng & Units 08/24/2022    9:31 AM 08/23/2022    3:29 PM 11/28/2019    3:02 AM  CBC  WBC 4.0 - 10.5 K/uL 10.3  14.0  12.3   Hemoglobin 12.0 - 15.0 g/dL 10.9  12.5  10.8   Hematocrit 36.0 - 46.0 % 33.1  38.3  32.9   Platelets 150 - 400 K/uL 190  259  238        Latest Ref Rng & Units 08/25/2022    3:03 AM 08/24/2022    9:31 AM 08/23/2022    3:29 PM  BMP  Glucose 70 - 99 mg/dL 107  106  114   BUN 6 - 20 mg/dL <5  <5  <5   Creatinine 0.44 - 1.00 mg/dL 0.61  0.71  0.81   Sodium 135 - 145 mmol/L 132  133  134   Potassium 3.5 - 5.1 mmol/L 3.4  3.5  3.8   Chloride 98 - 111 mmol/L 99  100  98   CO2 22 - 32 mmol/L 23  22  19    Calcium 8.9 - 10.3 mg/dL 8.6  8.5  9.3       CT ABDOMEN PELVIS W CONTRAST 08/23/2022  IMPRESSION: 1. Mild to moderate noncomplicated pancreatitis. 2. Hepatic steatosis and hepatomegaly. Morphology for which mild cirrhosis is a concern. 3.  Aortic Atherosclerosis (ICD10-I70.0).   Discharge Instructions:  Discharge Instructions     Call MD for:  persistant nausea and vomiting   Complete by: As directed    Call MD for:  severe uncontrolled pain   Complete by: As directed    Call MD for:  temperature >100.4   Complete by: As directed    Diet general   Complete by: As directed    Discharge instructions   Complete by: As directed    [name],  Fue hospitalizado por  pancreatitis (inflamacin del pncreas), causada por el consumo de alcohol. Esto le provoc dolor abdominal, nuseas y vmitos y mejor con lquidos y Educational psychologist. El Publishing copy dieron el alta, poda volver a comer alimentos habituales y estaba mucho mejor!  Le damos de alta con analgsicos; puede tomar Tylenol 1000 mg hasta tres veces al da si tiene un poco de Social research officer, government. Si su dolor es muy intenso, puede tomar Dilaudid 1 mg cada 4 horas, si lo necesita. Si su dolor est bien controlado, no es necesario que tome Dilaudid. Es un medicamento opioide, as que no conduzca un automvil ni opere mquinas pesadas mientras lo toma. Adems,  debe mantener este medicamento fuera del alcance de cualquier otra persona.  Una vez que ya no necesite el [place], debe comenzar a tomar naltrexona 50 mg una [place]. Este [employer] ayudar a reducir sus antojos de alcohol y, con suerte, tambin evitar futuros episodios de pancreatitis. Es MUY importante que no comience a tomar Orthoptist que TERMINE de tomar dilaudid y no haya tomado nada de dilaudid durante 2 [place].  Por ltimo, hemos iniciado un nuevo medicamento llamado fenofibrato. Este [employer] se debe tomar una vez al da y ayudar a reducir Freight forwarder (triglicridos), ya que estos estaban muy altos. Es importante bajar ese nivel, porque si aumenta tambin puede causar pancreatitis.  Estamos muy contentos de que nos vea en nuestra clnica para sus necesidades de atencin primaria. Nuestra clnica de medicina interna est ubicada en la planta baja del hospital. Su cita es el 12 de febrero a las 9:15 a. m.; asegrese de llegar 15 minutos antes. Si tiene alguna pregunta o inquietud, llame a Haskell Riling al [phone] o fuera del horario de atencin llame al [phone] y pregunte por el residente de medicina interna de [place].  Cuidarse, Dr. Raymondo Band  Dear Ms. Cuervo,  You were hospitalized for pancreatitis (inflammation of  your pancreas), which was caused by alcohol use. This caused your abdominal pain, nausea, and vomiting and improved with fluids and pain medications. By the day you were discharged, you were able to eat regular foods again and were doing a lot better!   We are discharging you with pain medications- you can take Tylenol 1000 mg up to three times a day if you have a little bit of pain. If you pain is really bad, you can take Dilaudid 1 mg every 4 hours, if you need. If your pain is well controlled, you do not need to take the Dilaudid. It is an opioid medication, so do not drive a car or operate any heavy machines while you take this. Additionally, you should keep this medication out of reach from anyone else.   Once you are no longer needing the pain medication for two days, you should start taking naltrexone 50 mg once a day. This medicine will help to reduce your cravings for alcohol, which will also hopefully prevent any future episodes of pancreatitis. It is VERY important you do not start Naltrexone until you are FINISHED taking dilaudid and have not taken any dilaudid for 2 days.   Lastly, we have started a new medication called Fenofibrate. This medication should be taken once a day and will help lower your cholesterol (triglycerides), as those were very high. It is important to lower that level, because if that gets higher, it can also cause pancreatitis.   We are so glad you will be seeing [place] in our clinic for your primary care needs. Our internal medicine clinic is located on the ground floor of the hospital. Your appointment is February 12th at 9:15 AM - please be sure to arrive 15 minutes early. If you have any questions or concerns, call our clinic at [phone] or after hours call [phone] and ask for the internal medicine resident on call.  Take care, Dr. Raymondo Band   Increase activity slowly   Complete by: As directed          Signed: Spero Curb, Medical Student 08/25/2022,  1:28 PM   Pager: [phone]   Attestation for Student Documentation:  I personally was present and performed or re-performed the history,  physical exam and medical decision-making activities of this service and have verified that the service and findings are accurately documented in the student's note.  Angelique Blonder, DO 08/25/2022, 2:55 PM

## 2022-08-25 NOTE — Progress Notes (Signed)
Mobility Specialist Progress Note   08/25/22 1020  Mobility  Activity Ambulated independently in hallway  Level of Assistance Independent after set-up  Assistive Device None  Distance Ambulated (ft) 470 ft  Activity Response Tolerated well  Mobility Referral Yes  $Mobility charge 1 Mobility   Pt agreeable to mobility. Slight c/o abdominal pain at end of hallway ambulation but no faults. Returned back to room w/ all needs met.   Holland Falling Mobility Specialist Please contact via SecureChat or  Rehab office at [phone]

## 2022-08-25 NOTE — Plan of Care (Signed)
Pt D/C home to self with family. Pt. A&Ox4. No c/o pain, N/V. BP 117/81 (BP Location: Left Arm)   Pulse 87   Temp 98.4 F (36.9 C) (Oral)   Resp 18   Ht 5\' 4"  (1.626 m)   Wt 72.6 kg   SpO2 95%   BMI 27.46 kg/m  Pt reviewed AVS with no questions. Pt d/c home with family. Louanne Skye 08/25/22 2:28 PM

## 2022-08-26 ENCOUNTER — Telehealth: Payer: Self-pay

## 2022-08-26 NOTE — Telephone Encounter (Signed)
Transition Care Management Unsuccessful Follow-up Telephone Call  Date of discharge and from where:  [facility] 25/2024  Attempts:  1st Attempt  Reason for unsuccessful TCM follow-up call:  Left voice message Yang Rack, LPN [facility] Nurse Health Advisor Direct Dial [phone]     

## 2022-08-28 NOTE — Telephone Encounter (Signed)
Transition Care Management Unsuccessful Follow-up Telephone Call  Date of discharge and from where:  [facility] 08/25/2022  Attempts:  2nd Attempt  Reason for unsuccessful TCM follow-up call:  Left voice message Juanda Crumble, [place] Direct Dial [phone]

## 2022-09-01 ENCOUNTER — Ambulatory Visit (INDEPENDENT_AMBULATORY_CARE_PROVIDER_SITE_OTHER): Payer: BLUE CROSS/BLUE SHIELD | Admitting: Student

## 2022-09-01 ENCOUNTER — Other Ambulatory Visit: Payer: Self-pay

## 2022-09-01 ENCOUNTER — Encounter: Payer: Self-pay | Admitting: Student

## 2022-09-01 VITALS — BP 112/77 | HR 93 | Temp 98.2°F | Resp 28 | Ht 64.0 in | Wt 161.5 lb

## 2022-09-01 DIAGNOSIS — E781 Pure hyperglyceridemia: Secondary | ICD-10-CM

## 2022-09-01 DIAGNOSIS — K852 Alcohol induced acute pancreatitis without necrosis or infection: Secondary | ICD-10-CM

## 2022-09-01 DIAGNOSIS — D649 Anemia, unspecified: Secondary | ICD-10-CM

## 2022-09-01 DIAGNOSIS — K760 Fatty (change of) liver, not elsewhere classified: Secondary | ICD-10-CM

## 2022-09-01 DIAGNOSIS — Z0000 Encounter for general adult medical examination without abnormal findings: Secondary | ICD-10-CM

## 2022-09-01 DIAGNOSIS — E785 Hyperlipidemia, unspecified: Secondary | ICD-10-CM

## 2022-09-01 DIAGNOSIS — F1090 Alcohol use, unspecified, uncomplicated: Secondary | ICD-10-CM

## 2022-09-01 MED ORDER — ATORVASTATIN CALCIUM 40 MG PO TABS: 40.0000 mg | ORAL_TABLET | Freq: Every day | ORAL | 1 refills | Status: AC

## 2022-09-01 MED ORDER — NALTREXONE HCL 50 MG PO TABS: 50.0000 mg | ORAL_TABLET | Freq: Every day | ORAL | 1 refills | Status: AC

## 2022-09-01 NOTE — Patient Instructions (Signed)
Nicole Velasquez,  It was nice seeing you in the clinic today.  I am glad that you are doing better.  Here is a summary what we talked about:  1.  For your cholesterol please take both atorvastatin and fenofibrate.  I will call you for the result of your cholesterol panel and adjust the dosage of fenofibrate if needed.  I sent a refill of you atorvastatin to the pharmacy.  2.  Please continue alcohol abstinence.  Please continue taking naltrexone which will help with your alcohol craving.  3.  I will check blood work for kidney and liver function today.  4.  I also noticed that your hemoglobin was low from last admission.  I will recheck your blood work and iron study.  5.  Hepatitis C lab was checked today  Please return in 3 months.  Have a good trip  Dr. Alfonse Spruce

## 2022-09-01 NOTE — Progress Notes (Addendum)
CC: Hospital follow-up for pancreatitis  HPI:  [name] is a 47 y.o. female with hyperlipidemia, alcohol use disorder, who was recently admitted to our service in February 2024 for pancreatitis from alcohol use as well as hypertriglyceridemia.  She was started on fenofibrate, together with atorvastatin and naltrexone.  Please see problem based charting for detail  Past Medical History:  Diagnosis Date   Acute pancreatitis 11/27/2019   Hyperlipemia    Review of Systems:  per HPI  Physical Exam:  Vitals:   09/01/22 0920  BP: 112/77  Pulse: 93  Resp: (!) 28  Temp: 98.2 F (36.8 C)  TempSrc: Oral  SpO2: 99%  Weight: 161 lb 8 oz (73.3 kg)  Height: 5' 4"$  (1.626 m)   Physical Exam Constitutional:      General: She is not in acute distress. HENT:     Head: Normocephalic.  Eyes:     General:        Right eye: No discharge.        Left eye: No discharge.     Conjunctiva/sclera: Conjunctivae normal.  Cardiovascular:     Rate and Rhythm: Normal rate and regular rhythm.  Pulmonary:     Effort: Pulmonary effort is normal. No respiratory distress.     Breath sounds: Normal breath sounds. No stridor.  Abdominal:     General: Bowel sounds are normal. There is no distension.     Palpations: Abdomen is soft.  Musculoskeletal:        General: Normal range of motion.  Skin:    General: Skin is warm.  Neurological:     Mental Status: She is alert.  Psychiatric:        Mood and Affect: Mood normal.      Assessment & Plan:   See Encounters Tab for problem based charting.  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  The first episode was in 2021 which was thought due to alcohol abuse as well as hypertriglyceridemia.  Her presentation was similar at this time.  History is consistent with binge drinking and triglycerides 581.  There is no signs of pseudocyst or pancreatic necrosis on imaging.  Today patient reports resolution of her GI  symptoms.  She denies abdominal pain, diarrhea or constipation.  She reports normal p.o. intake.  She has complete alcohol abstinence since discharge    Steatosis of liver Evidence of hepatic steatosis on CT scan.  Fib 4 score was 1.2 which suggests low risk for advanced fibrosis.  No indication for elastography.  Risk factor including hypercholesterolemia and alcohol abuse.  We address these risk factors today.  Alcohol use disorder Patient history, patient does not have a daily alcohol drinking issue.  This is more likely of binge drinking from personal stressor.  She reports complete abstinence since discharge.  We emphasized the importance of avoiding alcohol in the setting of recurrent pancreatitis and steatosis.  She is taking naltrexone daily which is helping.  She said that she will be traveling to the country in [place] and she is worried about the alcohol use over there.  Patient is advised to only drink 1 alcoholic drink per day, avoid if able.  I strongly discouraged alcohol use at one time.  Patient states that she will try her best.  -Continue naltrexone -Can refer to [facility] for binge alcohol use disorder if patient is interested  Hypertriglyceridemia Patient is living with hypercholesterolemia and hypertriglyceridemia.  Last LDL was 149 and triglycerides 581.  She  was started on fenofibrate together with atorvastatin.  Unfortunately she did not receive atorvastatin after discharge but has been taking fenofibrate daily.    -Recheck lipid panel today.  Goal triglyceride less than 500.  Will adjust the dose of fenofibrate if needed -Send atorvastatin 40 mg to pharmacy -Recheck lipid panel in 3 months at follow-up  Healthcare maintenance -Hep C screening today -She is due for colonoscopy and Pap smear. Will discuss at a later time due to time issue.    Normocytic anemia Patient has normocytic anemia dated back 2021.  She still have menstrual cycle but only spotting now.  Denies  heavy bleeding.  She also denies melena or hematochezia.  No colonoscopy or EGD done in the past per chart review.  -Check CBC with iron storage.  If confirmed iron deficiency anemia, she would need a diagnostic colonoscopy   Patient discussed with Dr.  Cain Sieve

## 2022-09-01 NOTE — Assessment & Plan Note (Signed)
This is second episode of pancreatitis.  The first episode was in 2021 which was thought due to alcohol abuse as well as hypertriglyceridemia.  Her presentation was similar at this time.  History is consistent with binge drinking and triglycerides 581.  There is no signs of pseudocyst or pancreatic necrosis on imaging.  Today patient reports resolution of her GI symptoms.  She denies abdominal pain, diarrhea or constipation.  She reports normal p.o. intake.  She has complete alcohol abstinence since discharge

## 2022-09-01 NOTE — Assessment & Plan Note (Signed)
Patient has normocytic anemia dated back 2021.  She still have menstrual cycle but only spotting now.  Denies heavy bleeding.  She also denies melena or hematochezia.  No colonoscopy or EGD done in the past per chart review.  -Check CBC with iron storage.  If confirmed iron deficiency anemia, she would need a diagnostic colonoscopy

## 2022-09-01 NOTE — Assessment & Plan Note (Addendum)
Patient history, patient does not have a daily alcohol drinking issue.  This is more likely of binge drinking from personal stressor.  She reports complete abstinence since discharge.  We emphasized the importance of avoiding alcohol in the setting of recurrent pancreatitis and steatosis.  She is taking naltrexone daily which is helping.  She said that she will be traveling to the country in March and she is worried about the alcohol use over there.  Patient is advised to only drink 1 alcoholic drink per day, avoid if able.  I strongly discouraged alcohol use at one time.  Patient states that she will try her best.  -Continue naltrexone -Can refer to [facility] for binge alcohol use disorder if patient is interested

## 2022-09-01 NOTE — Assessment & Plan Note (Signed)
Evidence of hepatic steatosis on CT scan.  Fib 4 score was 1.2 which suggests low risk for advanced fibrosis.  No indication for elastography.  Risk factor including hypercholesterolemia and alcohol abuse.  We address these risk factors today.

## 2022-09-01 NOTE — Assessment & Plan Note (Signed)
-  Hep C screening today -She is due for colonoscopy and Pap smear. Will discuss at a later time due to time issue.

## 2022-09-01 NOTE — Assessment & Plan Note (Signed)
Patient is living with hypercholesterolemia and hypertriglyceridemia.  Last LDL was 149 and triglycerides 581.  She was started on fenofibrate together with atorvastatin.  Unfortunately she did not receive atorvastatin after discharge but has been taking fenofibrate daily.    -Recheck lipid panel today.  Goal triglyceride less than 500.  Will adjust the dose of fenofibrate if needed -Send atorvastatin 40 mg to pharmacy -Recheck lipid panel in 3 months at follow-up

## 2022-09-01 NOTE — Progress Notes (Signed)
Internal Medicine Clinic Attending  Case discussed with Dr. Nguyen  At the time of the visit.  We reviewed the resident's history and exam and pertinent patient test results.  I agree with the assessment, diagnosis, and plan of care documented in the resident's note. 

## 2022-09-03 ENCOUNTER — Telehealth: Payer: Self-pay

## 2022-09-03 LAB — CMP14 + ANION GAP
ALT: 21 IU/L (ref 0–32)
AST: 25 IU/L (ref 0–40)
Albumin/Globulin Ratio: 1.3 (ref 1.2–2.2)
Albumin: 4.6 g/dL (ref 3.9–4.9)
Alkaline Phosphatase: 95 IU/L (ref 44–121)
Anion Gap: 19 mmol/L — ABNORMAL HIGH (ref 10.0–18.0)
BUN/Creatinine Ratio: 7 — ABNORMAL LOW (ref 9–23)
BUN: 5 mg/dL — ABNORMAL LOW (ref 6–24)
Bilirubin Total: <0.2 mg/dL (ref 0.0–1.2)
CO2: 22 mmol/L (ref 20–29)
Calcium: 10.1 mg/dL (ref 8.7–10.2)
Chloride: 101 mmol/L (ref 96–106)
Creatinine, Ser: 0.67 mg/dL (ref 0.57–1.00)
Globulin, Total: 3.5 g/dL (ref 1.5–4.5)
Glucose: 122 mg/dL — ABNORMAL HIGH (ref 70–99)
Potassium: 4.5 mmol/L (ref 3.5–5.2)
Sodium: 142 mmol/L (ref 134–144)
Total Protein: 8.1 g/dL (ref 6.0–8.5)
eGFR: 110 mL/min/{1.73_m2} (ref >59)

## 2022-09-03 LAB — CBC
Hematocrit: 37.8 % (ref 34.0–46.6)
Hemoglobin: 12.5 g/dL (ref 11.1–15.9)
MCH: 29.9 pg (ref 26.6–33.0)
MCHC: 33.1 g/dL (ref 31.5–35.7)
MCV: 90 fL (ref 79–97)
Platelets: 386 10*3/uL (ref 150–450)
RBC: 4.18 x10E6/uL (ref 3.77–5.28)
RDW: 11.4 % — ABNORMAL LOW (ref 11.7–15.4)
WBC: 9.3 10*3/uL (ref 3.4–10.8)

## 2022-09-03 LAB — IRON,TIBC AND FERRITIN PANEL
Ferritin: 824 ng/mL — ABNORMAL HIGH (ref 15–150)
Iron Saturation: 20 % (ref 15–55)
Iron: 88 ug/dL (ref 27–159)
Total Iron Binding Capacity: 431 ug/dL (ref 250–450)
UIBC: 343 ug/dL (ref 131–425)

## 2022-09-03 LAB — LIPID PANEL
Chol/HDL Ratio: 6.8 ratio — ABNORMAL HIGH (ref 0.0–4.4)
Cholesterol, Total: 244 mg/dL — ABNORMAL HIGH (ref 100–199)
HDL: 36 mg/dL — ABNORMAL LOW (ref >39)
LDL Chol Calc (NIH): 172 mg/dL — ABNORMAL HIGH (ref 0–99)
Triglycerides: 193 mg/dL — ABNORMAL HIGH (ref 0–149)
VLDL Cholesterol Cal: 36 mg/dL (ref 5–40)

## 2022-09-03 LAB — HEPATITIS C ANTIBODY: Hep C Virus Ab: NONREACTIVE

## 2022-09-03 NOTE — Telephone Encounter (Signed)
Requesting to speak with Dr. Alfonse Spruce, please call pt back.

## 2022-12-23 ENCOUNTER — Encounter: Payer: Self-pay | Admitting: *Deleted

## 2023-11-06 ENCOUNTER — Telehealth: Payer: Self-pay

## 2023-11-06 NOTE — Telephone Encounter (Signed)
 Telephoned patient at home number using interpreter#[phone]. Left a voice message with [facility] contact information.

## 2024-02-11 ENCOUNTER — Emergency Department (HOSPITAL_COMMUNITY)
Admission: EM | Admit: 2024-02-11 | Discharge: 2024-02-12 | Disposition: A | Discharge status: Home / Self Care | Payer: No Typology Code available for payment source | Attending: Emergency Medicine | Admitting: Emergency Medicine

## 2024-02-11 ENCOUNTER — Emergency Department (HOSPITAL_COMMUNITY): Payer: No Typology Code available for payment source

## 2024-02-11 ENCOUNTER — Other Ambulatory Visit: Payer: Self-pay

## 2024-02-11 ENCOUNTER — Encounter (HOSPITAL_COMMUNITY): Payer: Self-pay

## 2024-02-11 DIAGNOSIS — M542 Cervicalgia: Secondary | ICD-10-CM | POA: Insufficient documentation

## 2024-02-11 DIAGNOSIS — M25531 Pain in right wrist: Secondary | ICD-10-CM | POA: Diagnosis present

## 2024-02-11 DIAGNOSIS — R1031 Right lower quadrant pain: Secondary | ICD-10-CM | POA: Diagnosis not present

## 2024-02-11 DIAGNOSIS — R1033 Periumbilical pain: Secondary | ICD-10-CM | POA: Diagnosis not present

## 2024-02-11 DIAGNOSIS — Y92410 Unspecified street and highway as the place of occurrence of the external cause: Secondary | ICD-10-CM | POA: Insufficient documentation

## 2024-02-11 DIAGNOSIS — S52571A Other intraarticular fracture of lower end of right radius, initial encounter for closed fracture: Secondary | ICD-10-CM | POA: Diagnosis not present

## 2024-02-11 LAB — CBC
HCT: 34.8 % — ABNORMAL LOW (ref 36.0–46.0)
Hemoglobin: 11.6 g/dL — ABNORMAL LOW (ref 12.0–15.0)
MCH: 30.6 pg (ref 26.0–34.0)
MCHC: 33.3 g/dL (ref 30.0–36.0)
MCV: 91.8 fL (ref 80.0–100.0)
Platelets: 237 K/uL (ref 150–400)
RBC: 3.79 MIL/uL — ABNORMAL LOW (ref 3.87–5.11)
RDW: 12 % (ref 11.5–15.5)
WBC: 10.6 K/uL — ABNORMAL HIGH (ref 4.0–10.5)
nRBC: 0 % (ref 0.0–0.2)

## 2024-02-11 LAB — BASIC METABOLIC PANEL WITH GFR
Anion gap: 11 (ref 5–15)
BUN: 5 mg/dL — ABNORMAL LOW (ref 6–20)
CO2: 19 mmol/L — ABNORMAL LOW (ref 22–32)
Calcium: 8.6 mg/dL — ABNORMAL LOW (ref 8.9–10.3)
Chloride: 104 mmol/L (ref 98–111)
Creatinine, Ser: 0.5 mg/dL (ref 0.44–1.00)
GFR, Estimated: >60 mL/min (ref >60)
Glucose, Bld: 126 mg/dL — ABNORMAL HIGH (ref 70–99)
Potassium: 3.2 mmol/L — ABNORMAL LOW (ref 3.5–5.1)
Sodium: 134 mmol/L — ABNORMAL LOW (ref 135–145)

## 2024-02-11 LAB — TROPONIN I (HIGH SENSITIVITY): Troponin I (High Sensitivity): 3 ng/L (ref <18)

## 2024-02-11 LAB — HCG, SERUM, QUALITATIVE: Preg, Serum: NEGATIVE

## 2024-02-11 MED ORDER — ONDANSETRON HCL 4 MG/2ML IJ SOLN
4.0000 mg | Freq: Once | INTRAMUSCULAR | Status: AC
  Administered 2024-02-11: 4 mg via INTRAVENOUS
  Filled 2024-02-11: qty 2

## 2024-02-11 MED ORDER — FENTANYL CITRATE PF 50 MCG/ML IJ SOSY
25.0000 ug | PREFILLED_SYRINGE | Freq: Once | INTRAMUSCULAR | Status: AC
  Administered 2024-02-11: 25 ug via INTRAVENOUS
  Filled 2024-02-11: qty 1

## 2024-02-11 MED ORDER — IOHEXOL 350 MG/ML SOLN
75.0000 mL | Freq: Once | INTRAVENOUS | Status: AC | PRN
  Administered 2024-02-11: 75 mL via INTRAVENOUS

## 2024-02-11 NOTE — ED Notes (Signed)
Ortho called for splint  

## 2024-02-11 NOTE — ED Notes (Signed)
 Patient transported to CT
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
## 2024-02-12 MED ORDER — IBUPROFEN 400 MG PO TABS
600.0000 mg | ORAL_TABLET | Freq: Once | ORAL | Status: AC
  Administered 2024-02-12: 600 mg via ORAL
  Filled 2024-02-12: qty 1

## 2024-02-12 MED ORDER — KETOROLAC TROMETHAMINE 15 MG/ML IJ SOLN: 15.0000 mg | Freq: Once | INTRAMUSCULAR | Status: DC

## 2024-02-12 MED ORDER — ACETAMINOPHEN 500 MG PO TABS
1000.0000 mg | ORAL_TABLET | Freq: Once | ORAL | Status: AC
  Administered 2024-02-12: 1000 mg via ORAL
  Filled 2024-02-12: qty 2

## 2024-02-12 MED ORDER — METHOCARBAMOL 500 MG PO TABS: 500.0000 mg | ORAL_TABLET | Freq: Three times a day (TID) | ORAL | 0 refills | Status: AC | PRN

## 2024-02-12 NOTE — Progress Notes (Signed)
 Orthopedic Tech Progress Note Patient Details:  Nicole Velasquez September 03, 1975 [id number]  Ortho Devices Type of Ortho Device: Cotton web roll, Ace wrap, Sugartong splint, Sling immobilizer Ortho Device/Splint Location: RUE Ortho Device/Splint Interventions: Ordered, Application, Adjustment   Post Interventions Patient Tolerated: Well Instructions Provided: Care of device   Nicole Velasquez 02/12/2024, 1:17 AM
